# Patient Record
Sex: Male | Born: 1975 | Race: White | Hispanic: No | Marital: Single | Smoking: Current every day smoker
Health system: Southern US, Community
[De-identification: ages and names within clinical notes are randomized; demographics above are authoritative.]

## PROBLEM LIST (undated history)

## (undated) HISTORY — PX: BACK SURGERY: SHX140

---

## 2001-01-17 ENCOUNTER — Emergency Department (HOSPITAL_COMMUNITY): Admission: EM | Admit: 2001-01-17 | Discharge: 2001-01-17 | Payer: Self-pay

## 2001-01-18 ENCOUNTER — Encounter: Admission: RE | Admit: 2001-01-18 | Discharge: 2001-01-18 | Payer: Self-pay | Admitting: Internal Medicine

## 2001-01-25 ENCOUNTER — Encounter: Admission: RE | Admit: 2001-01-25 | Discharge: 2001-01-25 | Payer: Self-pay

## 2004-10-03 ENCOUNTER — Ambulatory Visit (HOSPITAL_COMMUNITY): Admission: RE | Admit: 2004-10-03 | Discharge: 2004-10-04 | Payer: Self-pay | Admitting: Neurological Surgery

## 2006-05-08 ENCOUNTER — Emergency Department (HOSPITAL_COMMUNITY): Admission: EM | Admit: 2006-05-08 | Discharge: 2006-05-08 | Payer: Self-pay | Admitting: Emergency Medicine

## 2020-09-19 ENCOUNTER — Other Ambulatory Visit: Payer: Self-pay | Admitting: Neurological Surgery

## 2020-09-19 DIAGNOSIS — M5416 Radiculopathy, lumbar region: Secondary | ICD-10-CM

## 2020-10-16 ENCOUNTER — Other Ambulatory Visit: Payer: Self-pay

## 2020-10-16 ENCOUNTER — Ambulatory Visit
Admission: RE | Admit: 2020-10-16 | Discharge: 2020-10-16 | Disposition: A | Discharge status: Home / Self Care | Payer: No Typology Code available for payment source | Source: Ambulatory Visit | Attending: Neurological Surgery | Admitting: Neurological Surgery

## 2020-10-16 DIAGNOSIS — M5416 Radiculopathy, lumbar region: Secondary | ICD-10-CM

## 2020-10-16 MED ORDER — GADOBENATE DIMEGLUMINE 529 MG/ML IV SOLN
17.0000 mL | Freq: Once | INTRAVENOUS | Status: AC | PRN
  Administered 2020-10-16: 17 mL via INTRAVENOUS

## 2021-07-12 ENCOUNTER — Emergency Department (HOSPITAL_COMMUNITY)
Admission: EM | Admit: 2021-07-12 | Discharge: 2021-07-13 | Disposition: A | Discharge status: Home / Self Care | Payer: No Typology Code available for payment source | Attending: Emergency Medicine | Admitting: Emergency Medicine

## 2021-07-12 ENCOUNTER — Emergency Department (HOSPITAL_COMMUNITY): Payer: No Typology Code available for payment source

## 2021-07-12 ENCOUNTER — Other Ambulatory Visit: Payer: Self-pay

## 2021-07-12 ENCOUNTER — Encounter (HOSPITAL_COMMUNITY): Payer: Self-pay

## 2021-07-12 DIAGNOSIS — F1124 Opioid dependence with opioid-induced mood disorder: Secondary | ICD-10-CM | POA: Insufficient documentation

## 2021-07-12 DIAGNOSIS — F419 Anxiety disorder, unspecified: Secondary | ICD-10-CM | POA: Insufficient documentation

## 2021-07-12 DIAGNOSIS — Z20822 Contact with and (suspected) exposure to covid-19: Secondary | ICD-10-CM | POA: Insufficient documentation

## 2021-07-12 DIAGNOSIS — R079 Chest pain, unspecified: Secondary | ICD-10-CM | POA: Insufficient documentation

## 2021-07-12 DIAGNOSIS — F32A Depression, unspecified: Secondary | ICD-10-CM

## 2021-07-12 DIAGNOSIS — R45851 Suicidal ideations: Secondary | ICD-10-CM | POA: Insufficient documentation

## 2021-07-12 NOTE — ED Provider Notes (Addendum)
Emergency Medicine Provider Triage Evaluation Note  Douglas Long , a 45 y.o. male  was evaluated in triage.  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 Also complaining of chest pain in the center of chest for 2 days. Feels anxious. Admits to some SOB. Also admits to suicidal ideations.   Review of Systems  Positive: CP, sob Negative: Nausea, vomiting, fevers   Physical Exam  BP (!) 164/88 (BP Location: Right Arm)   Pulse 94   Temp 98.6 F (37 C) (Oral)   Resp 16   Ht 5\' 6"  (1.676 m)   Wt 83.8 kg   SpO2 97%   BMI 29.83 kg/m  Gen:   Awake, no distress   Resp:  Normal effort  MSK:   Moves extremities without difficulty  Other:    Medical Decision Making  Medically screening exam initiated at 11:22 PM.  Appropriate orders placed.  Douglas Long was informed that the remainder of the evaluation will be completed by another provider, this initial triage assessment does not replace that evaluation, and the importance of remaining in the ED until their evaluation is complete.      Kerin Ransom, PA-C 07/12/21 2327    07/14/21, MD 07/13/21 [phone]

## 2021-07-12 NOTE — ED Triage Notes (Signed)
Pt reports needing detox for methadone and xanax. Pt endorses chest pain and feeling very anxious. Pt is SI, and denies HI and A/V hallucinations.

## 2021-07-13 ENCOUNTER — Inpatient Hospital Stay (HOSPITAL_COMMUNITY)
Admission: AD | Admit: 2021-07-13 | Discharge: 2021-07-18 | DRG: 897 | Disposition: A | Discharge status: Home / Self Care | Payer: Federal, State, Local not specified - Other | Source: Intra-hospital | Attending: Emergency Medicine | Admitting: Emergency Medicine

## 2021-07-13 ENCOUNTER — Other Ambulatory Visit: Payer: Self-pay | Admitting: Psychiatry

## 2021-07-13 ENCOUNTER — Encounter (HOSPITAL_COMMUNITY): Payer: Self-pay | Admitting: Psychiatry

## 2021-07-13 DIAGNOSIS — F1994 Other psychoactive substance use, unspecified with psychoactive substance-induced mood disorder: Secondary | ICD-10-CM | POA: Diagnosis present

## 2021-07-13 DIAGNOSIS — F1124 Opioid dependence with opioid-induced mood disorder: Secondary | ICD-10-CM | POA: Diagnosis present

## 2021-07-13 DIAGNOSIS — F1924 Other psychoactive substance dependence with psychoactive substance-induced mood disorder: Secondary | ICD-10-CM | POA: Diagnosis present

## 2021-07-13 DIAGNOSIS — F1120 Opioid dependence, uncomplicated: Secondary | ICD-10-CM | POA: Diagnosis present

## 2021-07-13 DIAGNOSIS — R45851 Suicidal ideations: Secondary | ICD-10-CM | POA: Diagnosis present

## 2021-07-13 DIAGNOSIS — F1310 Sedative, hypnotic or anxiolytic abuse, uncomplicated: Secondary | ICD-10-CM | POA: Diagnosis present

## 2021-07-13 DIAGNOSIS — E119 Type 2 diabetes mellitus without complications: Secondary | ICD-10-CM | POA: Diagnosis present

## 2021-07-13 LAB — COMPREHENSIVE METABOLIC PANEL
ALT: 204 U/L — ABNORMAL HIGH (ref 0–44)
AST: 142 U/L — ABNORMAL HIGH (ref 15–41)
Albumin: 4.5 g/dL (ref 3.5–5.0)
Alkaline Phosphatase: 73 U/L (ref 38–126)
Anion gap: 9 (ref 5–15)
BUN: 11 mg/dL (ref 6–20)
CO2: 27 mmol/L (ref 22–32)
Calcium: 9.4 mg/dL (ref 8.9–10.3)
Chloride: 101 mmol/L (ref 98–111)
Creatinine, Ser: 0.82 mg/dL (ref 0.61–1.24)
GFR, Estimated: >60 mL/min (ref >60)
Glucose, Bld: 92 mg/dL (ref 70–99)
Potassium: 3.5 mmol/L (ref 3.5–5.1)
Sodium: 137 mmol/L (ref 135–145)
Total Bilirubin: 0.8 mg/dL (ref 0.3–1.2)
Total Protein: 7.6 g/dL (ref 6.5–8.1)

## 2021-07-13 LAB — CBC
HCT: 40 % (ref 39.0–52.0)
Hemoglobin: 13.9 g/dL (ref 13.0–17.0)
MCH: 33.4 pg (ref 26.0–34.0)
MCHC: 34.8 g/dL (ref 30.0–36.0)
MCV: 96.2 fL (ref 80.0–100.0)
Platelets: 258 10*3/uL (ref 150–400)
RBC: 4.16 MIL/uL — ABNORMAL LOW (ref 4.22–5.81)
RDW: 12.9 % (ref 11.5–15.5)
WBC: 7.5 10*3/uL (ref 4.0–10.5)
nRBC: 0 % (ref 0.0–0.2)

## 2021-07-13 LAB — RAPID URINE DRUG SCREEN, HOSP PERFORMED
Amphetamines: NOT DETECTED
Barbiturates: NOT DETECTED
Benzodiazepines: POSITIVE — AB
Cocaine: NOT DETECTED
Opiates: NOT DETECTED
Tetrahydrocannabinol: NOT DETECTED

## 2021-07-13 LAB — TROPONIN I (HIGH SENSITIVITY): Troponin I (High Sensitivity): 6 ng/L (ref <18)

## 2021-07-13 LAB — RESP PANEL BY RT-PCR (FLU A&B, COVID) ARPGX2
Influenza A by PCR: NEGATIVE
Influenza B by PCR: NEGATIVE
SARS Coronavirus 2 by RT PCR: NEGATIVE

## 2021-07-13 LAB — SALICYLATE LEVEL: Salicylate Lvl: <7 mg/dL — ABNORMAL LOW (ref 7.0–30.0)

## 2021-07-13 LAB — ACETAMINOPHEN LEVEL: Acetaminophen (Tylenol), Serum: <10 ug/mL — ABNORMAL LOW (ref 10–30)

## 2021-07-13 LAB — ETHANOL: Alcohol, Ethyl (B): <10 mg/dL (ref <10)

## 2021-07-13 MED ORDER — HYDROXYZINE HCL 25 MG PO TABS: 25.0000 mg | ORAL_TABLET | Freq: Four times a day (QID) | ORAL | Status: DC | PRN

## 2021-07-13 MED ORDER — NAPROXEN 500 MG PO TABS
500.0000 mg | ORAL_TABLET | Freq: Two times a day (BID) | ORAL | Status: DC | PRN
  Administered 2021-07-13 – 2021-07-17 (×5): 500 mg via ORAL
  Filled 2021-07-13 (×5): qty 1

## 2021-07-13 MED ORDER — ONDANSETRON 4 MG PO TBDP: 4.0000 mg | ORAL_TABLET | Freq: Four times a day (QID) | ORAL | Status: DC | PRN

## 2021-07-13 MED ORDER — LORAZEPAM 1 MG PO TABS
1.0000 mg | ORAL_TABLET | Freq: Four times a day (QID) | ORAL | Status: AC
  Administered 2021-07-13 – 2021-07-14 (×5): 1 mg via ORAL
  Filled 2021-07-13 (×5): qty 1

## 2021-07-13 MED ORDER — LORAZEPAM 1 MG PO TABS
1.0000 mg | ORAL_TABLET | Freq: Once | ORAL | Status: AC
  Administered 2021-07-13: 1 mg via ORAL
  Filled 2021-07-13: qty 1

## 2021-07-13 MED ORDER — LORAZEPAM 1 MG PO TABS
2.0000 mg | ORAL_TABLET | Freq: Once | ORAL | Status: AC
  Administered 2021-07-13: 2 mg via ORAL

## 2021-07-13 MED ORDER — LORAZEPAM 1 MG PO TABS
1.0000 mg | ORAL_TABLET | Freq: Four times a day (QID) | ORAL | Status: DC | PRN
  Administered 2021-07-14 (×2): 1 mg via ORAL
  Filled 2021-07-13 (×3): qty 1

## 2021-07-13 MED ORDER — LORAZEPAM 1 MG PO TABS
2.0000 mg | ORAL_TABLET | Freq: Once | ORAL | Status: AC
  Administered 2021-07-13: 2 mg via ORAL
  Filled 2021-07-13: qty 2

## 2021-07-13 MED ORDER — DICYCLOMINE HCL 20 MG PO TABS
20.0000 mg | ORAL_TABLET | Freq: Four times a day (QID) | ORAL | Status: DC | PRN
  Administered 2021-07-13 – 2021-07-17 (×4): 20 mg via ORAL
  Filled 2021-07-13 (×4): qty 1

## 2021-07-13 MED ORDER — HYDROXYZINE HCL 25 MG PO TABS
25.0000 mg | ORAL_TABLET | Freq: Four times a day (QID) | ORAL | Status: AC | PRN
  Administered 2021-07-13 – 2021-07-16 (×4): 25 mg via ORAL
  Filled 2021-07-13 (×4): qty 1

## 2021-07-13 MED ORDER — THIAMINE HCL 100 MG PO TABS
100.0000 mg | ORAL_TABLET | Freq: Every day | ORAL | Status: DC
Start: 2021-07-14 — End: 2021-07-18
  Administered 2021-07-14 – 2021-07-18 (×5): 100 mg via ORAL
  Filled 2021-07-13 (×7): qty 1

## 2021-07-13 MED ORDER — LOPERAMIDE HCL 2 MG PO CAPS
2.0000 mg | ORAL_CAPSULE | ORAL | Status: AC | PRN
  Filled 2021-07-13: qty 2

## 2021-07-13 MED ORDER — LORAZEPAM 1 MG PO TABS
1.0000 mg | ORAL_TABLET | Freq: Three times a day (TID) | ORAL | Status: AC
  Administered 2021-07-15 (×3): 1 mg via ORAL
  Filled 2021-07-13 (×3): qty 1

## 2021-07-13 MED ORDER — LORAZEPAM 1 MG PO TABS
1.0000 mg | ORAL_TABLET | Freq: Every day | ORAL | Status: AC
  Administered 2021-07-17: 1 mg via ORAL
  Filled 2021-07-13: qty 1

## 2021-07-13 MED ORDER — ONDANSETRON 4 MG PO TBDP
4.0000 mg | ORAL_TABLET | Freq: Four times a day (QID) | ORAL | Status: AC | PRN
  Administered 2021-07-13 – 2021-07-15 (×3): 4 mg via ORAL
  Filled 2021-07-13 (×4): qty 1

## 2021-07-13 MED ORDER — NAPROXEN 500 MG PO TABS
500.0000 mg | ORAL_TABLET | Freq: Two times a day (BID) | ORAL | Status: DC | PRN
Start: 2021-07-13 — End: 2021-07-13

## 2021-07-13 MED ORDER — METHOCARBAMOL 500 MG PO TABS
500.0000 mg | ORAL_TABLET | Freq: Three times a day (TID) | ORAL | Status: DC | PRN
  Administered 2021-07-13 – 2021-07-17 (×7): 500 mg via ORAL
  Filled 2021-07-13 (×7): qty 1

## 2021-07-13 MED ORDER — NICOTINE 21 MG/24HR TD PT24
21.0000 mg | MEDICATED_PATCH | Freq: Once | TRANSDERMAL | Status: DC
  Administered 2021-07-13: 21 mg via TRANSDERMAL
  Filled 2021-07-13: qty 1

## 2021-07-13 MED ORDER — LOPERAMIDE HCL 2 MG PO CAPS: 2.0000 mg | ORAL_CAPSULE | ORAL | Status: DC | PRN

## 2021-07-13 MED ORDER — LORAZEPAM 1 MG PO TABS
ORAL_TABLET | ORAL | Status: AC
  Filled 2021-07-13: qty 2

## 2021-07-13 MED ORDER — NICOTINE 14 MG/24HR TD PT24
14.0000 mg | MEDICATED_PATCH | Freq: Every day | TRANSDERMAL | Status: DC
  Administered 2021-07-14 – 2021-07-18 (×5): 14 mg via TRANSDERMAL
  Filled 2021-07-13 (×8): qty 1

## 2021-07-13 MED ORDER — METHOCARBAMOL 500 MG PO TABS: 500.0000 mg | ORAL_TABLET | Freq: Three times a day (TID) | ORAL | Status: DC | PRN

## 2021-07-13 MED ORDER — DICYCLOMINE HCL 20 MG PO TABS: 20.0000 mg | ORAL_TABLET | Freq: Four times a day (QID) | ORAL | Status: DC | PRN

## 2021-07-13 MED ORDER — LORAZEPAM 1 MG PO TABS
1.0000 mg | ORAL_TABLET | Freq: Two times a day (BID) | ORAL | Status: AC
  Administered 2021-07-16 (×2): 1 mg via ORAL
  Filled 2021-07-13 (×2): qty 1

## 2021-07-13 MED ORDER — THIAMINE HCL 100 MG/ML IJ SOLN: 100.0000 mg | Freq: Once | INTRAMUSCULAR | Status: DC

## 2021-07-13 MED ORDER — ADULT MULTIVITAMIN W/MINERALS CH
1.0000 | ORAL_TABLET | Freq: Every day | ORAL | Status: DC
Start: 2021-07-13 — End: 2021-07-18
  Administered 2021-07-14 – 2021-07-18 (×5): 1 via ORAL
  Filled 2021-07-13 (×9): qty 1

## 2021-07-13 NOTE — Progress Notes (Signed)
CSW provided that resources listed below for the patient.   Substance Abuse Treatment:   [facility] ([facility])   Address: [address], [place] [address] Phone: [phone]  -[facility] is a licensed, innovative, multi-year residential treatment program. [facility] is a cost-free program. You do not need insurance. [facility]ett Harry is a Field seismologist. Each day, [facility] gives more than 400 men and women the tools and support they need to be productive, recovering individuals by providing life skills and vocational training, education, health services, counseling, mentoring, and continuing care. Most importantly, we provide a safe space for peers to help each other in their recovery. We provide these services and housing, food, clothing, and personal care items to every resident at no charge.  [facility] - Admissions are currently completed Monday through Friday at 8am; both appointments and walk-ins are accepted.  Any individual that is a [facility] resident may present for a substance abuse screening and assessment for admission.  A person may be referred by numerous sources or self-refer.   Potential clients will be screened for medical necessity and appropriateness for the program.  Clients must meet criteria for high-intensity residential treatment services.  If clinically appropriate, a client will continue with the comprehensive clinical assessment and intake process, as well as enrollment in the [facility].  Address: [address] [place], [place] [address] Admin Hours: Mon-Fri 8AM to [facility] Hours: 24/7 Phone: [phone] Fax: [phone]  [facility] (Det[facility]) Facility Based Crisis:  These are 3 locations for services: Please call before arrival   Address: [address] [place], [place] [address] Phone: [phone]  Address: [address] [place], [place] [address] Phone#: [phone]  Address:  [address] [place], [place] [address] Phone#: [phone]   [facility] ([facility]): (offers outpatient therapy and intensive outpatient substance abuse therapy).  [address] [place], [place] [address] Phone: [phone]  [facility]: Offers FREE recovery skills classes, support groups, 1:1 Peer Support, and Compeer Classes. [address], [place], [place] [address] Phone: [phone] (Call to complete intake).  [facility] [address] [place], [place] [address] Phone: [phone] ext: [phone] [facility] provides food, shelter and other programs and services to the homeless men of [place] through our [employer].  By offering safe shelter, three meals a day, clean clothing, Biblical counseling, financial planning, vocational training, GED/education and employment assistance, we've helped mend the shattered lives of many homeless men since opening in 1974.  We have approximately 267 beds available, with a max of 312 beds including mats for emergency situations and currently house an average of 270 men a night.  Prospective Client Check-In Information Photo ID Required (State/ Out of State/ [facility]) - if photo ID is not available, clients are required to have a printout of a police/sheriff's criminal history report. Help out with chores around the [facility]. No sex offender of any type (pending, charged, registered and/or any other sex related offenses) will be permitted to check in. Must be willing to abide by all rules, regulations, and policies established by the [employer]. The following will be provided - shelter, food, clothing, and biblical counseling. If you or someone you know is in need of assistance at our [facility] shelter in [place], [place], please call [phone] ext. [phone].  [facility]-will provide timely access to mental health  services for children and adolescents (4-17) and adults presenting in a mental health crisis. The program is designed for those who need urgent Behavioral Health or Substance Use treatment and are not experiencing a medical crisis that would typically require an emergency room visit.    [address] [place], [place] [address] Phone: [phone] [email or web address]  [facility]: Phone#: [phone]  The [facility] SA Intensive Outpatient Program (SAIOP) means structured individual and group addiction activities and services that are provided at an outpatient program designed to assist adult and adolescent consumers to begin recovery and learn skills for recovery maintenance. The [employer] SAIOP program is offered at least 3 hours a day, 3 days a week.SAIOP services shall include a structured program consisting of, but not limited to, the following services: Individual counseling and support; Group counseling and support; Family counseling, training or support; Biochemical assays to identify recent drug use (e.g., urine drug screens); Strategies for relapse prevention to include community and social support systems in treatment; Life skills; Crisis contingency planning; Disease Management; and Treatment support activities that have been adapted or specifically designed for persons with physical disabilities, or persons with co-occurring disorders of mental illness and substance abuse/dependence or mental retardation/developmental disability and substance abuse/dependence. Phone: [phone]  Address:   The [facility] will also offer the following outpatient services: (Monday through Friday 8am-5pm)   Partial Hospitalization Program (PHP) Substance Abuse Intensive Outpatient Program (SA-IOP) Group Therapy Medication Management Peer Living Room We also provide (24/7):  Assessments: Our mental health clinician and providers will conduct a  focused mental health evaluation, assessing for immediate safety concerns and further mental health needs. Referral: Our team will provide resources and help connect to community based mental health treatment, when indicated, including psychotherapy, psychiatry, and other specialized behavioral health or substance use disorder services (for those not already in treatment). Transitional Care: Our team providers in person bridging and/or telephonic follow-up during the patient's transition to outpatient services.  The [facility] 24-Hour Call Center: [phone] Behavioral Health Crisis Line: [phone]  Crissie Reese, MSW, LCSW-A, [place] Phone: [phone] Disposition/TOC

## 2021-07-13 NOTE — ED Notes (Signed)
Safe transport notified that pt needs a ride to [facility]. Pt transporter he will be here around 3pm

## 2021-07-13 NOTE — Tx Team (Signed)
Initial Treatment Plan 07/13/2021 7:01 PM Douglas Long ATF:[id number]    PATIENT STRESSORS: Health problems Marital or family conflict Substance abuse   PATIENT STRENGTHS: Ability for insight Communication skills Supportive family/friends   PATIENT IDENTIFIED PROBLEMS: Suicidal ideation  Depression  Anxiety  Substance use disorder methadone and benzos               DISCHARGE CRITERIA:  Ability to meet basic life and health needs Improved stabilization in mood, thinking, and/or behavior Verbal commitment to aftercare and medication compliance  PRELIMINARY DISCHARGE PLAN: Attend aftercare/continuing care group Attend 12-step recovery group Return to previous living arrangement  PATIENT/FAMILY INVOLVEMENT: This treatment plan has been presented to and reviewed with the patient, Douglas Long.  The patient has been given the opportunity to ask questions and make suggestions.  Garnette Scheuermann, RN 07/13/2021, 7:01 PM

## 2021-07-13 NOTE — Progress Notes (Signed)
   07/13/21 2242  Psych Admission Type (Psych Patients Only)  Admission Status Voluntary  Psychosocial Assessment  Patient Complaints Substance abuse  Eye Contact Fair  Facial Expression Flat  Affect Appropriate to circumstance  Speech Logical/coherent  Interaction Minimal  Motor Activity Fidgety  Appearance/Hygiene Unremarkable  Behavior Characteristics Appropriate to situation  Mood Anxious  Thought Process  Coherency WDL  Content WDL  Delusions None reported or observed  Perception WDL  Hallucination None reported or observed  Judgment Impaired  Confusion None  Danger to Self  Current suicidal ideation? Denies  Danger to Others  Danger to Others None reported or observed

## 2021-07-13 NOTE — ED Provider Notes (Signed)
[facility]-EMERGENCY DEPT Provider Note   CSN: [id number] Arrival date & time: 07/12/21  2301     History Chief Complaint  Patient presents with   Suicidal    Douglas Long is a 45 y.o. male.  The history is provided by the patient and medical records.   45 y.o. M here with multiple complaints.  Reports he needs help with addiction to methadone and xanax.  Last use earlier today.  States he uses both of these substances on a regular basis but has gotten "off schedule" recently.  He states he feels incredibly depressed because he has become reliant on the substances and does not know how to get off of them.  He states he feels like he is a huge disappointment and has no further will to live.  Does endorse some suicidal ideation without specific plan.  States his parents brought him here because they are concerned about his mental state.  He states he does not think they understand what he is going through due to the age difference.  He does have a significant other who somewhat understands, however he does not feel that he can relate to his issues.  He does not take any medication for depression currently.  He denies any illicit drug use.  No significant alcohol abuse.  Patient also reports some vague chest pain.  Reports this comes and goes, center of the chest.  He feels like it is mostly related to his anxiety.  He denies any cough, fever, or upper respiratory symptoms.  History reviewed. No pertinent past medical history.  There are no problems to display for this patient.   History reviewed. No pertinent surgical history.     History reviewed. No pertinent family history.     Home Medications Prior to Admission medications   Not on File    Allergies    Patient has no known allergies.  Review of Systems   Review of Systems  Cardiovascular:  Positive for chest pain.  Psychiatric/Behavioral:  Positive for suicidal ideas.   All other systems  reviewed and are negative.  Physical Exam Updated Vital Signs BP (!) 164/88 (BP Location: Right Arm)   Pulse 94   Temp 98.6 F (37 C) (Oral)   Resp 16   Ht 5\' 6"  (1.676 m)   Wt 83.8 kg   SpO2 97%   BMI 29.83 kg/m   Physical Exam Vitals and nursing note reviewed.  Constitutional:      Appearance: He is well-developed.  HENT:     Head: Normocephalic and atraumatic.  Eyes:     Conjunctiva/sclera: Conjunctivae normal.     Pupils: Pupils are equal, round, and reactive to light.  Cardiovascular:     Rate and Rhythm: Normal rate and regular rhythm.     Heart sounds: Normal heart sounds.  Pulmonary:     Effort: Pulmonary effort is normal.     Breath sounds: Normal breath sounds.  Abdominal:     General: Bowel sounds are normal.     Palpations: Abdomen is soft.  Musculoskeletal:        General: Normal range of motion.     Cervical back: Normal range of motion.  Skin:    General: Skin is warm and dry.  Neurological:     Mental Status: He is alert and oriented to person, place, and time.  Psychiatric:        Mood and Affect: Mood is anxious.     Comments: Anxious  appearing, fidgeting throughout exam Endorses SI without plan, denies HI/AVH    ED Results / Procedures / Treatments   Labs (all labs ordered are listed, but only abnormal results are displayed) Labs Reviewed  CBC - Abnormal; Notable for the following components:      Result Value   RBC 4.16 (*)    All other components within normal limits  COMPREHENSIVE METABOLIC PANEL - Abnormal; Notable for the following components:   AST 142 (*)    ALT 204 (*)    All other components within normal limits  RAPID URINE DRUG SCREEN, HOSP PERFORMED - Abnormal; Notable for the following components:   Benzodiazepines POSITIVE (*)    All other components within normal limits  ACETAMINOPHEN LEVEL - Abnormal; Notable for the following components:   Acetaminophen (Tylenol), Serum <10 (*)    All other components within normal  limits  SALICYLATE LEVEL - Abnormal; Notable for the following components:   Salicylate Lvl <7.0 (*)    All other components within normal limits  RESP PANEL BY RT-PCR (FLU A&B, COVID) ARPGX2  ETHANOL  TROPONIN I (HIGH SENSITIVITY)    EKG None  Radiology DG Chest Port 1 View  Result Date: 07/12/2021 CLINICAL DATA:  Chest pain and shortness of breath. EXAM: PORTABLE CHEST 1 VIEW COMPARISON:  None. FINDINGS: The cardiomediastinal contours are normal. Slight elevation of right hemidiaphragm. The lungs are clear. Pulmonary vasculature is normal. No consolidation, pleural effusion, or pneumothorax. No acute osseous abnormalities are seen. IMPRESSION: No acute chest findings. Electronically Signed   By: Narda Rutherford M.D.   On: 07/12/2021 23:49    Procedures Procedures   Medications Ordered in ED Medications - No data to display  ED Course  I have reviewed the triage vital signs and the nursing notes.  Pertinent labs & imaging results that were available during my care of the patient were reviewed by me and considered in my medical decision making (see chart for details).    MDM Rules/Calculators/A&P                           45 year old male brought in by parents for mental health evaluation.  Reportedly has been using methadone and Xanax for several years now and feels dependent on the substances causing worsening depression.  Does admit to some suicidal ideation but no specific plan.  Denies any homicidal ideation or hallucinations.  Denies illicit drug use.  He is afebrile and nontoxic.  No physical complaints at this time although did reports some chest pain recent, ultimately felt like this was due to his anxiety.  Vitals are stable.  EKG is nonischemic.  Labs are overall reassuring-- LFT's are elevated but no prior for comparison.  No abdominal pain, normal bili.  Tylenol level WNL.  Chest x-ray is clear.  COVID screen negative.  Patient medically cleared.  Will get TTS  evaluation.  TTS has evaluated and recommends inpatient placement.  No available beds at this time so social work will seek placement in the morning.  Final Clinical Impression(s) / ED Diagnoses Final diagnoses:  Depression, unspecified depression type    Rx / DC Orders ED Discharge Orders     None        Garlon Hatchet, PA-C 07/13/21 0504    Maia Plan, MD 07/13/21 0630

## 2021-07-13 NOTE — Discharge Instructions (Signed)
Substance Abuse Treatment:   [facility] ([facility])   [address]: [address], [place] [address] Phone: [phone]  -[facility] is a licensed, innovative, multi-year residential treatment program. [facility] is a cost-free program. You do not need insurance. Burnett Harry is a Field seismologist. Each day, [facility] gives more than 400 men and women the tools and support they need to be productive, recovering individuals by providing life skills and vocational training, education, health services, counseling, mentoring, and continuing care. Most importantly, we provide a safe space for peers to help each other in their recovery. We provide these services and housing, food, clothing, and personal care items to every resident at no charge.  [facility] - Admissions are currently completed Monday through Friday at 8am; both appointments and walk-ins are accepted.  Any individual that is a [facility] resident may present for a substance abuse screening and assessment for admission.  A person may be referred by numerous sources or self-refer.   Potential clients will be screened for medical necessity and appropriateness for the program.  Clients must meet criteria for high-intensity residential treatment services.  If clinically appropriate, a client will continue with the comprehensive clinical assessment and intake process, as well as enrollment in the [facility].  Address: [address] [place], [place] [address] Admin Hours: Mon-Fri 8AM to [facility] Hours: 24/7 Phone: [phone] Fax: [phone]  [facility] ([facility]) Facility Based Crisis:  These are 3 locations for services: Please call before arrival   Address: [address] [place], [place] [address] Phone: [phone]  Address: [address] [place], [place] [address] Phone#: [phone]  Address: [address] [place], [place] [address] Phone#:  [phone]   [facility] ([facility]): (offers outpatient therapy and intensive outpatient substance abuse therapy).  [address], [place], [place] [address] Phone: [phone]  [facility] [place] [facility]: Offers FREE recovery skills classes, support groups, 1:1 Peer Support, and Compeer Classes. [address] [place], [place] [address] Phone: [phone] (Call to complete intake).  [facility] [address] [place], [place] [address] Phone: [phone] ext: [phone] The [facility] provides food, shelter and other programs and services to the homeless men of [place] through our [employer].  By offering safe shelter, three meals a day, clean clothing, Biblical counseling, financial planning, vocational training, GED/education and employment assistance, we've helped mend the shattered lives of many homeless men since opening in 1974.  We have approximately 267 beds available, with a max of 312 beds including mats for emergency situations and currently house an average of 270 men a night.  Prospective Client Check-In Information Photo ID Required (State/ Out of State/ [facility]) - if photo ID is not available, clients are required to have a printout of a police/sheriff's criminal history report. Help out with chores around the [facility]. No sex offender of any type (pending, charged, registered and/or any other sex related offenses) will be permitted to check in. Must be willing to abide by all rules, regulations, and policies established by the [employer]. The following will be provided - shelter, food, clothing, and biblical counseling. If you or someone you know is in need of assistance at our [facility] shelter in [place], [place], please call [phone] ext. [phone].  [facility]-will provide timely access to mental health services for children and adolescents (4-17) and adults presenting in a  mental health crisis. The program is designed for those who need urgent Behavioral Health or Substance Use treatment and are not experiencing a medical crisis that would typically require an emergency room visit.    [address] [place], [place] [address] Phone: [phone] [email or web address]  [facility]: Phone#: [phone]  The [facility] SA Intensive Outpatient Program (SAIOP) means structured individual and group addiction activities and services that are provided at an outpatient program designed to assist adult and adolescent consumers to begin recovery and learn skills for recovery maintenance. The [employer] SAIOP program is offered at least 3 hours a day, 3 days a week.SAIOP services shall include a structured program consisting of, but not limited to, the following services: Individual counseling and support; Group counseling and support; Family counseling, training or support; Biochemical assays to identify recent drug use (e.g., urine drug screens); Strategies for relapse prevention to include community and social support systems in treatment; Life skills; Crisis contingency planning; Disease Management; and Treatment support activities that have been adapted or specifically designed for persons with physical disabilities, or persons with co-occurring disorders of mental illness and substance abuse/dependence or mental retardation/developmental disability and substance abuse/dependence. Phone: [phone]  Address:   [facility] will also offer the following outpatient services: (Monday through Friday 8am-5pm)   Partial Hospitalization Program (PHP) Substance Abuse Intensive Outpatient Program (SA-IOP) Group Therapy Medication Management Peer Living Room We also provide (24/7):  Assessments: Our mental health clinician and providers will conduct a focused mental health evaluation, assessing for immediate safety concerns  and further mental health needs. Referral: Our team will provide resources and help connect to community based mental health treatment, when indicated, including psychotherapy, psychiatry, and other specialized behavioral health or substance use disorder services (for those not already in treatment). Transitional Care: Our team providers in person bridging and/or telephonic follow-up during the patient's transition to outpatient services.  The [facility] 24-Hour Call Center: [phone] Behavioral Health Crisis Line: [phone]

## 2021-07-13 NOTE — BH Assessment (Signed)
TTS spoke to Douglas Long, [place], to put pt in a private room to complete TTS assessment.  Clinician to call the cart in ten minutes.

## 2021-07-13 NOTE — Progress Notes (Signed)
Pt is a 45 y.o. male admitted with SI and withdrawing from methodone and benzodiazepines.  Pt said that he needs to have surgery on his back and his orthopedic doctor would not perform pt's back surgery because pt was taking 175mg  of methodone and benzodiazepines 4mg  per day.  Pt said he was trying to withdraw gradually but thinks he went off methodone too quickly.  Pt hasn't had methodone in several days. Pt given ativan twice in ED.   Pt denies SI and HI on [facility] admission, but endorses visual hallucinations and "seeing stars."  Pt says he hasn't slept in 2 weeks; "only 10 minutes here 20 minutes there." Pt administered 2 mg of Ativan when he got settled on the unit.  Pt is resting at this time.  RN initiated q 15 min safety checks.

## 2021-07-13 NOTE — ED Notes (Signed)
TTS consult in progress. °

## 2021-07-13 NOTE — BH Assessment (Signed)
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 Acute risk factors for suicide include: family or marital conflict, unemployment, social withdrawal/isolation, and loss (financial, interpersonal, professional). Protective factors for this patient include: positive social support, positive therapeutic relationship, coping skills, hope for the future, and life satisfaction. Considering these factors, the overall suicide risk at this point appears to be high. Patient is not appropriate for outpatient follow up.  Disposition Roselyn Bering NP, patient meets inpatient criteria, [facility] AC contacted.  Disposition Social Worker will secure substance use placement in the AM.  Disposition discussed with Chemical engineer, via secure chat in Epic.  RN to discuss disposition with EDP.   Pamala Duffel. Douglas Long is a 45 years old male who presents voluntarily to [facility] and accompanied by his parents, Greycen Felter, [phone], who participated in the assessment at Pt's request via telephone. Per dad: He came to the house yesterday afternoon and wouldn't say anything and wouldn't eat;  he just laid on the floor.  After lunch today he started saying he wished  he was dead, he wished someone would shoot him.  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  I think he goes somewhere and get some; I think he goes somewhere and gets it.  He used to go the meth clinic in  [place] but that's been several years back. Never attempted to kill himself in the past. Pt reports SI and prior suicidal thoughts by wanting to shoot himself.  Pt denies HI and prior homicidal attempts.  Pt reports the following symptoms, isolating, irritable, hopelessness, guilt, worthlessness and talking negative about himself.  Pt reports that he have not been sleeping during the night; also reports that he have not been eating (skipping meals).  Pt denied AVH.  Pt reports "I feel paranoia all the time, I feel that someone is following me".  Pt admits to using methadone and xanax daily.  Pt denies drinking alcohol and using any other substance.  Pt reports that he smokes cigarettes; also reports that he have vaped in the passed.  Pt identifies his primary stressor as his parents "I have disappointed them, I feel bad when I am around my parents".  Pt reports that he lives with his parents; also, currently unemployed.  Pt denies family history of mental illness; also, denies history of substance used..  Pt denies any history of abuse or trauma.  Pt denies any current legal problems.  Pt reports no guns or weapons or in the house.  Pt says he is not currently receiving weekly outpatient therapy; also, reports that he is not receiving outpatient medication management.  Pt reports that he had five years of sobriety, due to receiving  Medication Assistance Treatment from [facility] in [facility] (1996-2001).   Pt reports no prior inpatient psychiatric hospitalization.  Pt is dressed in scrubs, alert, oriented x 4 with loud speech and restless motor behavior.   Eye contact is normal.  Pt mood  anxious and affect depressed.  Thought process is relevant.  Pt's insight is good and judgment is impaired.  There is no indication Pt is currently responding to internal stimuli or experiencing delusional thought content.  Pt was guarded throughout assessment.   CCA Screening, Triage and Referral (STR)  Patient Reported  Information How did you hear about us? Family/Friend  What Is the Reason for Your Visit/Call Today? SI, depressed  How Long Has This Been Causing You Problems? 1 wk - 1 month  What Do You Feel Would Help You the Most Today? Alcohol or Drug Use Treatment; Treatment for Depression or other mood problem   Have You Recently Had Any Thoughts About Hurting Yourself? Yes  Are You Planning to Commit Suicide/Harm Yourself At This time? No   Have you Recently Had Thoughts About Hurting Someone Karolee Ohslse? No  Are You Planning to Harm Someone at This Time? No  Explanation: No data recorded  Have You Used Any Alcohol or Drugs in the Past 24 Hours? No  How Long Ago Did You Use Drugs or Alcohol? No data recorded What Did You Use and How Much? No data recorded  Do You Currently Have a Therapist/Psychiatrist? No  Name of Therapist/Psychiatrist: No data recorded  Have You Been Recently Discharged From Any Office Practice or Programs? No  Explanation of Discharge From Practice/Program: No data recorded    CCA Screening Triage Referral Assessment Type of Contact: Tele-Assessment  Telemedicine Service Delivery: Telemedicine service delivery: This service was provided via telemedicine using a 2-way, interactive audio and video technology  Is this Initial or Reassessment? Initial Assessment  Date Telepsych consult ordered in [facility]:  07/13/21  Time Telepsych consult ordered in [facility]:  No data recorded [facility]  [facility]   Collateral Involvement: No collateral involved   Does Patient Have a Automotive engineerCourt Appointed Legal Guardian? No data recorded Name and Contact of Legal Guardian: No data recorded If Minor and Not Living with Parent(s), Who has Custody? n/a  Is CPS involved or ever been involved? Never  Is APS involved or ever been involved? Never   Patient Determined To Be At Risk for Harm To Self or Others Based on  Review of Patient Reported Information or Presenting Complaint? Yes, for Self-Harm  Method: No data recorded Availability of Means: No data recorded Intent: No data recorded Notification Required: No data recorded Additional Information for Danger to Others Potential: No data recorded Additional Comments for Danger to Others Potential: No data recorded Are There Guns or Other Weapons in Your Home? No data recorded Types of Guns/Weapons: No data recorded Are These Weapons Safely Secured?                            No data recorded Who Could Verify You Are Able To Have These Secured: No data recorded Do You Have any Outstanding Charges, Pending Court Dates, Parole/Probation? No data recorded Contacted To Inform of Risk of Harm To Self or Others: Family/Significant Other:    Does Patient Present under Involuntary Commitment? No  IVC Papers Initial File Date: No data recorded  [place]   Patient Currently Receiving the Following Services: SAIOP (Substance Abuse Intensive Outpatient Program   Determination of Need: Urgent (48 hours)   Options For Referral: Medication Management; Chemical Dependency Intensive Outpatient Therapy (CDIOP)     CCA Biopsychosocial Patient Reported Schizophrenia/Schizoaffective Diagnosis in Past: No  Strengths: asking for support   Mental Health Symptoms Depression:   Change in energy/activity; Sleep (too much or little); Difficulty Concentrating; Fatigue; Hopelessness; Worthlessness   Duration of Depressive symptoms:  Duration of Depressive Symptoms: Less than two weeks   Mania:   None   Anxiety:    Fatigue; Difficulty concentrating; Irritability; Restlessness; Sleep; Tension; Worrying   Psychosis:   None   Duration of Psychotic symptoms:    Trauma:   None   Obsessions:   Disrupts routine/functioning; Poor insight; Recurrent & persistent thoughts/impulses/images   Compulsions:   Disrupts with  routine/functioning; Intended to reduce stress or prevent another outcome; Not connected to stressor   Inattention:   None   Hyperactivity/Impulsivity:   None   Oppositional/Defiant Behaviors:   None   Emotional Irregularity:   Chronic feelings of emptiness; Intense/unstable relationships; Recurrent suicidal behaviors/gestures/threats; Potentially harmful impulsivity; Transient, stress-related paranoia/disassociation   Other Mood/Personality Symptoms:   disrupted sleep    Mental Status Exam Appearance and self-care  Stature:   Average   Weight:   Overweight   Clothing:   Casual   Grooming:   Neglected   Cosmetic use:   Age appropriate   Posture/gait:   Normal   Motor activity:   Agitated; Restless   Sensorium  Attention:   Persistent; Confused   Concentration:   Anxiety interferes; Preoccupied   Orientation:   Object; Person; Place; Situation   Recall/memory:   Normal   Affect and Mood  Affect:   Anxious; Depressed   Mood:   Angry; Anxious; Depressed   Relating  Eye contact:   Normal   Facial expression:   Sad; Anxious   Attitude toward examiner:   Suspicious   Thought and Language  Speech flow:  Loud; Pressured   Thought content:   Appropriate to Mood and Circumstances   Preoccupation:   Guilt; Suicide   Hallucinations:   None   Organization:  No data recorded  [employer] of Knowledge:   Average   Intelligence:   Average   Abstraction:   Normal   Judgement:   Impaired   Reality Testing:   Variable   Insight:   Good   Decision Making:   Impulsive   Social Functioning  Social Maturity:   Impulsive; Isolates   Social Judgement:   Impropriety   Stress  Stressors:   Family conflict; Relationship   Coping Ability:   Exhausted   Skill Deficits:   Decision making; Self-care; Self-control; Responsibility   Supports:   Family     Religion: Religion/Spirituality Are You A Religious  Person?:  (UTA) How Might This Affect Treatment?: UTA  Leisure/Recreation: Leisure / Recreation Do You Have Hobbies?: Yes Leisure and Hobbies: fishing  Exercise/Diet: Exercise/Diet Do You Exercise?: No Have You Gained or Lost A Significant Amount of Weight in the Past Six Months?: No Do You Follow a Special Diet?: No Do You Have Any Trouble Sleeping?: Yes Explanation of Sleeping Difficulties: Pt reports that he is sleeping one hour during the night.   CCA Employment/Education Employment/Work Situation: Employment / Work Situation Employment Situation: Unemployed Patient's Job has Been Impacted by Current Illness: No Has Patient ever Been in Equities trader?: No  Education: Education Is Patient Currently Attending School?: No Last Grade Completed: 2 Did You Product manager?: No Did You Have An Individualized Education Program (IIEP):  (UTA) Did You Have Any Difficulty At School?:  (UTA) Patient's Education Has Been Impacted by Current Illness:  (UTA)   CCA  Family/Childhood History Family and Relationship History: Family history Marital status: Single Does patient have children?: No  Childhood History:  Childhood History By whom was/is the patient raised?: Both parents Did patient suffer any verbal/emotional/physical/sexual abuse as a child?: No Did patient suffer from severe childhood neglect?: No Has patient ever been sexually abused/assaulted/raped as an adolescent or adult?: No Was the patient ever a victim of a crime or a disaster?: No Witnessed domestic violence?: No Has patient been affected by domestic violence as an adult?: No  Child/Adolescent Assessment:     CCA Substance Use Alcohol/Drug Use: Alcohol / Drug Use Pain Medications: See MRA Prescriptions: See MRA Over the Counter: See MRA History of alcohol / drug use?: No history of alcohol / drug abuse Longest period of sobriety (when/how long): 5 years, 1996 - 2001 Negative Consequences of Use:  Personal relationships, Financial Withdrawal Symptoms: Agitation, Aggressive/Assaultive, Patient aware of relationship between substance abuse and physical/medical complications, Sweats                         ASAM's:  Six Dimensions of Multidimensional Assessment  Dimension 1:  Acute Intoxication and/or Withdrawal Potential:   Dimension 1:  Description of individual's past and current experiences of substance use and withdrawal: Pt reports that he uses methadone and xanax daily  Dimension 2:  Biomedical Conditions and Complications:   Dimension 2:  Description of patient's biomedical conditions and  complications: pt reports no biomedical conditons  Dimension 3:  Emotional, Behavioral, or Cognitive Conditions and Complications:  Dimension 3:  Description of emotional, behavioral, or cognitive conditions and complications: depression and anxiety  Dimension 4:  Readiness to Change:  Dimension 4:  Description of Readiness to Change criteria: Pt reports that he wants to change, have not started cutting back  Dimension 5:  Relapse, Continued use, or Continued Problem Potential:  Dimension 5:  Relapse, continued use, or continued problem potential critiera description: Pt continued use/precontemplation  Dimension 6:  Recovery/Living Environment:  Dimension 6:  Recovery/Iiving environment criteria description: Pt reports that he  lives in a safe environment  ASAM Severity Score: ASAM's Severity Rating Score: 9  ASAM Recommended Level of Treatment: ASAM Recommended Level of Treatment: Level II Intensive Outpatient Treatment   Substance use Disorder (SUD) Substance Use Disorder (SUD)  Checklist Symptoms of Substance Use: Continued use despite having a persistent/recurrent physical/psychological problem caused/exacerbated by use, Continued use despite persistent or recurrent social, interpersonal problems, caused or exacerbated by use, Evidence of tolerance, Evidence of withdrawal (Comment),  Persistent desire or unsuccessful efforts to cut down or control use, Presence of craving or strong urge to use, Recurrent use that results in a failure to fulfill major role obligations (work, school, home), Repeated use in physically hazardous situations  Recommendations for Services/Supports/Treatments: Recommendations for Services/Supports/Treatments Recommendations For Services/Supports/Treatments: Residential-Level 2, SAIOP (Substance Abuse Intensive Outpatient Program)  Discharge Disposition:    DSM5 Diagnoses: There are no problems to display for this patient.    Referrals to Alternative Service(s): Referred to Alternative Service(s):   Place:   Date:   Time:    Referred to Alternative Service(s):   Place:   Date:   Time:    Referred to Alternative Service(s):   Place:   Date:   Time:    Referred to Alternative Service(s):   Place:   Date:   Time:     Meryle Ready, Counselor

## 2021-07-13 NOTE — Progress Notes (Signed)
Per Ysidro Evert Bobbitt,NP, patient meets criteria for inpatient treatment. There are no available or appropriate beds at [facility] today. CSW faxed referrals to the following facilities for review:  [facility]  TTS will continue to seek bed placement.  Crissie Reese, MSW, LCSW-A, LCAS-A Phone: [phone] Disposition/TOC

## 2021-07-14 DIAGNOSIS — F1994 Other psychoactive substance use, unspecified with psychoactive substance-induced mood disorder: Secondary | ICD-10-CM

## 2021-07-14 DIAGNOSIS — F1120 Opioid dependence, uncomplicated: Secondary | ICD-10-CM

## 2021-07-14 MED ORDER — CLONIDINE HCL 0.1 MG PO TABS
0.1000 mg | ORAL_TABLET | Freq: Three times a day (TID) | ORAL | Status: DC | PRN
  Administered 2021-07-17: 0.1 mg via ORAL
  Filled 2021-07-14: qty 1

## 2021-07-14 MED ORDER — ACETAMINOPHEN 325 MG PO TABS
650.0000 mg | ORAL_TABLET | Freq: Four times a day (QID) | ORAL | Status: DC | PRN
  Administered 2021-07-14 – 2021-07-16 (×4): 650 mg via ORAL
  Filled 2021-07-14 (×3): qty 2

## 2021-07-14 MED ORDER — ACETAMINOPHEN 325 MG PO TABS
ORAL_TABLET | ORAL | Status: AC
  Filled 2021-07-14: qty 2

## 2021-07-14 MED ORDER — MIRTAZAPINE 7.5 MG PO TABS
7.5000 mg | ORAL_TABLET | Freq: Every day | ORAL | Status: DC
  Filled 2021-07-14 (×3): qty 1
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
## 2021-07-14 NOTE — BHH Group Notes (Signed)
[facility] LCSW Group Therapy Note  Date/Time:  07/14/2021 10:00-11:00AM  Type of Therapy and Topic:  Group Therapy:  Healthy and Unhealthy Supports  Participation Level:  Active   Description of Group:  Patients in this group were introduced to the idea of adding a variety of healthy supports to address the various needs in their lives.Patients discussed what additional healthy supports could be helpful in their recovery and wellness after discharge in order to prevent future hospitalizations.   An emphasis was placed on using counselor, doctor, therapy groups, 12-step groups, and problem-specific support groups to expand supports.  Several songs were played to emphasize points made throughout group.  Therapeutic Goals:   1)  discuss importance of adding supports to stay well once out of the hospital  2)  compare healthy versus unhealthy supports and identify some examples of each  3)  generate ideas and descriptions of healthy supports that can be added  4)  offer mutual support about how to address unhealthy supports  5)  encourage active participation in and adherence to discharge plan    Summary of Patient Progress:  The patient stated that current healthy supports in his life are his 2 chihuahua puppies, his girlfriend, and his parents while current unhealthy supports include himself.  The patient expressed a willingness to add support(s) to help in his recovery journey.  He was attentive and goal-directed throughout group.   Therapeutic Modalities:   Motivational Interviewing Brief Solution-Focused Therapy  Ambrose Mantle, LCSW

## 2021-07-14 NOTE — Progress Notes (Signed)
   07/14/21 2307  Psych Admission Type (Psych Patients Only)  Admission Status Voluntary  Psychosocial Assessment  Patient Complaints Substance abuse  Eye Contact Fair  Facial Expression Anxious  Affect Appropriate to circumstance  Speech Logical/coherent  Interaction Minimal  Motor Activity Fidgety;Restless  Appearance/Hygiene Unremarkable  Behavior Characteristics Appropriate to situation  Mood Anxious;Pleasant  Thought Process  Coherency WDL  Content WDL  Delusions None reported or observed  Perception WDL  Hallucination None reported or observed  Judgment Impaired  Confusion None  Danger to Self  Current suicidal ideation? Denies  Danger to Others  Danger to Others None reported or observed

## 2021-07-14 NOTE — BHH Suicide Risk Assessment (Signed)
[facility] Admission Suicide Risk Assessment   Nursing information obtained from:  Patient Demographic factors:  Male, Caucasian Current Mental Status:  SI on admission Loss Factors:  Decline in physical health Historical Factors:  Impulsivity Risk Reduction Factors:  Positive social support  Total Time Spent in Direct Patient Care:  I personally spent 40 minutes on the unit in direct patient care. The direct patient care time included face-to-face time with the patient, reviewing the patient's chart, communicating with other professionals, and coordinating care. Greater than 50% of this time was spent in counseling or coordinating care with the patient regarding goals of hospitalization, psycho-education, and discharge planning needs.  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 He was voluntarily admitted to [facility] due to reported SI.  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 He states he is currently on Methadone 175mg  daily as written by [facility] but has been attempting to wean himself down on the dose in the hopes that his orthopedic surgeon will approve for him to have another back surgery. He states his surgeon tells him that until he is off cigarettes and on a lower Methadone dose he will not qualify for the procedure. When asked why he has not told his methadone clinic that he needs to start tapering the dose, he admits he had planned to sell any left over methadone to supplement his income since he is unemployed. He has not sold any of his medications to date by his report. He is vague  as to what his daily Methadone dose has been over the last 3 months based on his own self-taper stating he "has lost track" of his daily dosing and often varies the dose. He has not had any methadone since last Thursday but is vague as to his current opiate withdrawal symptoms. He admits that in addition, he has been buying Xanax up to 4mg  daily off the streets and using daily Xanax for 10 years. When asked how he is passing his UDS at the methadone clinic he admits he uses someone else's urine samples at his appointments. He has not had any Xanax since Thursday and states he was having severe anxiety until started on a CIWA scheduled benzodiazepine taper on arrival to the unit. He states he feels guilty about his drug use and feels depressed in the context of belief that he has disappointed his parents. He is vague as to how long his depression has been present but states he has had insomnia for the last 20 days and low appetite. He is interested in an antidepressant trial and is not sure if he wants a residential rehab program or SAIOP for his addictions. He thinks he wants to remain on Methadone and work with the methadone clinic to gradually wean down on the dose but does want help coming off Xanax. He states he drinks small amounts about every 2 months and denies other recent illicit or prescription drug abuse. He denies h/o mania/hypomania. He states that when he is "sleep deprived" and detoxing he will sometimes see "sparklers" in his peripheral vision. He denies other psychotic symptoms, AVH, or paranoia. He states  that yesterday he had passive SI without a plan and denies current SI. He denies HI. He denies previous suicide attempts or psychiatric admissions. He denies previous prescribed psychotropic medication trials. See H&P for additional details.  Continued Clinical Symptoms:  Alcohol Use Disorder Identification Test Final Score (AUDIT): 0 The "Alcohol Use Disorders Identification Test", Guidelines  for Use in Primary Care, Second Edition.  World Science writer [facility]). Score between 0-7:  no or low risk or alcohol related problems. Score between 8-15:  moderate risk of alcohol related problems. Score between 16-19:  high risk of alcohol related problems. Score 20 or above:  warrants further diagnostic evaluation for alcohol dependence and treatment.  CLINICAL FACTORS:   Depression:   Hopelessness More than one psychiatric diagnosis  Musculoskeletal: Strength & Muscle Tone: within normal limits Gait & Station: normal, steady Patient leans: N/A  Psychiatric Specialty Exam: Physical Exam Vitals reviewed.  HENT:     Head: Normocephalic.  Pulmonary:     Effort: Pulmonary effort is normal.  Neurological:     General: No focal deficit present.     Mental Status: He is alert.    Review of Systems - see H&P  Blood pressure 126/90, pulse (!) 115, temperature 99.1 F (37.3 C), temperature source Oral, resp. rate 18, height 5\' 6"  (1.676 m), weight 83 kg, SpO2 99 %.Body mass index is 29.54 kg/m.  General Appearance:  adequate hygiene, casually dressed  Eye Contact:  Fair  Speech:  Clear and Coherent and Normal Rate  Volume:  Normal  Mood:  Anxious and Dysphoric  Affect:  anxious and at times tearful  Thought Process:  Goal Directed and Linear  Orientation:  Full (Time, Place, and Person)  Thought Content:  Logical and no evidence of acute psychosis, delusions, or paranoia on exam  Suicidal Thoughts:   passive SI prior to admission - denies current SI, intent or plan  Homicidal Thoughts:  No  Memory:  Recent;   Good  Judgement:  Impaired  Insight:  Lacking  Psychomotor Activity:   fidgety during exam  Concentration:  Concentration: Fair and Attention Span: Fair  Recall:  Good  Fund of Knowledge:  Fair  Language:  Good  Akathisia:  Negative  Assets:  Communication Skills Desire for Improvement Resilience Social Support  ADL's:  Intact  Cognition:  WNL  Sleep:  Number  of Hours: 6.75   COGNITIVE FEATURES THAT CONTRIBUTE TO RISK:  Thought constriction (tunnel vision)    SUICIDE RISK:  Mild - no current SI, intent or plan and no previous suicide attempts  PLAN OF CARE: Patient admitted voluntarily to [facility]. Admission labs reviewed: WBC 7.5, H/H 13.9/40, platelets 258, Troponin I 6, CMP WNL except for AST 142 and ALT 204; ETOH <10, UDS positive for benzodiazepines; Tylenol <10, Salicylate <7, Respiratory panel negative; A1c, lipid, TSH pending. Will order Hepatitis panel and repeat Hepatic function panel. Will inquire if patient would consent to HIV testing given past IVD history. He has been placed on a scheduled benzodiazepine taper with CIWA monitoring for benzo withdrawal and on PRNS for potential opiate withdrawal. We will attempt to contact his Methadone clinic tomorrow to verify his Methadone dosing. I discussed options of antidepressant options with the patient and he agrees to consider and discuss with resident physician further. I encouraged him to consider residential rehab/SAIOP after discharge.   I certify that inpatient services furnished can reasonably be expected to improve the patient's condition.   12-18-2007, MD, FAPA 07/14/2021, 7:26  AM

## 2021-07-14 NOTE — Progress Notes (Signed)
Adult Psychoeducational Group Note  Date:  07/14/2021 Time:  10:51 PM  Group Topic/Focus:  Wrap-Up Group:   The focus of this group is to help patients review their daily goal of treatment and discuss progress on daily workbooks.  Participation Level:  Active  Participation Quality:  Appropriate  Affect:  Appropriate  Cognitive:  Appropriate  Insight: Appropriate  Engagement in Group:  Developing/Improving  Modes of Intervention:  Discussion  Additional Comments:  Pt stated his goal for today was to focus on his treatment plan and attend all groups held. Pt stated he accomplished his goals today. Pt stated he did not talked with his doctor or his social worker about his care today. Pt rated his overall day a 5 out of 10. Pt stated he made no calls today. Pt stated he felt better about himself today. Pt stated he was able to attend all meals. Pt stated he took all medications provided today. Pt stated he attend all groups held today. Pt stated his appetite was improved today. Pt rated sleep last night was fair. Pt stated the goal tonight was to get some rest. Pt stated he had some physical pain tonight. Pt stated he had some moderate pain in his stomach tonight. Pt rated the moderate pain in his stomach a 6 on the pain level scale. Pt deny visual hallucinations and auditory issues tonight. Pt denies thoughts of harming himself or others. Pt stated he would alert staff if anything changed.  Felipa Furnace 07/14/2021, 10:51 PM

## 2021-07-14 NOTE — Progress Notes (Signed)
   07/14/21 1130  Psych Admission Type (Psych Patients Only)  Admission Status Voluntary  Psychosocial Assessment  Patient Complaints Anxiety;Depression  Eye Contact Fair  Facial Expression Flat  Affect Appropriate to circumstance  Speech Logical/coherent  Interaction Minimal  Motor Activity Fidgety  Appearance/Hygiene Unremarkable  Behavior Characteristics Cooperative;Anxious  Mood Depressed;Anxious  Thought Process  Coherency WDL  Content WDL  Delusions None reported or observed  Perception WDL  Hallucination None reported or observed  Judgment Impaired  Confusion None  Danger to Self  Current suicidal ideation? Denies  Danger to Others  Danger to Others None reported or observed

## 2021-07-15 DIAGNOSIS — F1994 Other psychoactive substance use, unspecified with psychoactive substance-induced mood disorder: Secondary | ICD-10-CM | POA: Diagnosis present

## 2021-07-15 DIAGNOSIS — F1120 Opioid dependence, uncomplicated: Secondary | ICD-10-CM | POA: Diagnosis present

## 2021-07-15 DIAGNOSIS — F1310 Sedative, hypnotic or anxiolytic abuse, uncomplicated: Secondary | ICD-10-CM | POA: Diagnosis present

## 2021-07-15 LAB — HEPATITIS PANEL, ACUTE
HCV Ab: NONREACTIVE
Hep A IgM: NONREACTIVE
Hep B C IgM: NONREACTIVE
Hepatitis B Surface Ag: NONREACTIVE

## 2021-07-15 LAB — HEPATIC FUNCTION PANEL
ALT: 177 U/L — ABNORMAL HIGH (ref 0–44)
AST: 88 U/L — ABNORMAL HIGH (ref 15–41)
Albumin: 4.3 g/dL (ref 3.5–5.0)
Alkaline Phosphatase: 67 U/L (ref 38–126)
Bilirubin, Direct: 0.2 mg/dL (ref 0.0–0.2)
Indirect Bilirubin: 0.6 mg/dL (ref 0.3–0.9)
Total Bilirubin: 0.8 mg/dL (ref 0.3–1.2)
Total Protein: 7.3 g/dL (ref 6.5–8.1)

## 2021-07-15 LAB — LIPID PANEL
Cholesterol: 180 mg/dL (ref 0–200)
HDL: 33 mg/dL — ABNORMAL LOW (ref >40)
LDL Cholesterol: 129 mg/dL — ABNORMAL HIGH (ref 0–99)
Total CHOL/HDL Ratio: 5.5 RATIO
Triglycerides: 90 mg/dL (ref <150)
VLDL: 18 mg/dL (ref 0–40)

## 2021-07-15 LAB — TSH: TSH: 0.677 u[IU]/mL (ref 0.350–4.500)

## 2021-07-15 MED ORDER — MIRTAZAPINE 15 MG PO TABS
15.0000 mg | ORAL_TABLET | Freq: Every day | ORAL | Status: DC
  Administered 2021-07-15 – 2021-07-17 (×3): 15 mg via ORAL
  Filled 2021-07-15 (×3): qty 1
  Filled 2021-07-15: qty 7
  Filled 2021-07-15 (×3): qty 1

## 2021-07-15 MED ORDER — LORAZEPAM 1 MG PO TABS
1.0000 mg | ORAL_TABLET | Freq: Four times a day (QID) | ORAL | Status: DC | PRN
  Administered 2021-07-16 – 2021-07-18 (×3): 1 mg via ORAL
  Filled 2021-07-15 (×3): qty 1

## 2021-07-15 NOTE — Progress Notes (Signed)
Recreation Therapy Notes  Date:  7.25.22 Time: 0930 Location: 300 Hall Dayroom  Group Topic: Stress Management  Goal Area(s) Addresses:  Patient will identify positive stress management techniques. Patient will identify benefits of using stress management post d/c.  Intervention: Stress Management  Activity : Meditation.  LRT played a meditation that focused on being indecisive.  Meditation talked about being okay with the decisions you make even if they turn out to be the wrong decision, there is still room to learn from them.   Education:  Stress Management, Discharge Planning.   Education Outcome: Acknowledges Education  Clinical Observations/Feedback: Pt did not attend group session.    Caroll Rancher, LRT/CTRS         Caroll Rancher A 07/15/2021 12:01 PM

## 2021-07-15 NOTE — Progress Notes (Signed)
D:  Patient's self inventory sheet, patient has poor sleep, no sleep medication.  Good appetite, low energy level, good concentration.  Rated depression, hopeless and anxiety 6.  Withdrawals, tremors, diarrhea, cramping, agitation, irritability.  Denied SI.  Denied ph ysical problems, then checked lightheaded, pain, dizzy, headaches, blurred vision.  Physical pain, worst pain #9 in past 24 hours.  Wants to find out the I am I used to be.  Plans to listen to all rules and do what is asked of him.  No discharge plans. A:  Medications administered per MD orders.  Emotional support and encouragement given patient. R:  Denied SI and HI, contracts for safety.  Denied A/V hallucinations.  Safety maintained with 15 minute checks.

## 2021-07-15 NOTE — Progress Notes (Addendum)
[facility] MD Progress Note  07/15/2021 2:40 PM SONIA STICKELS  MRN:  [id number] Subjective:   El Pile is a 43 YOM with opioid use disorder who presents voluntarily for depression and passive SI.  24 hr events: no documented behavioral issues, no PRN medications given for agitation. Pt received Ativan 1 mg x2 over the past 24 hrs. Patient did not meet CIWA requirements for administration, no documentation regarding why the medication was given  On interview this morning patient reports his mood as "okay". He reports feeling numb emotionally and says that he is trying to find himself. When challenged to engage in goal directed thinking patient obliges and says that he can try to get out of bed and also try journaling today. He reports wanting to leave the hospital by Tuesday morning so that he can make it to his Methadone clinic to avoid getting "restarted", however, patient is amenable to continued stay. He reports a desire to restart methadone and was hoping to receive that during this hospitalization. He is accepting when told that this will not happen. He denies SI/HI/AVH, but does report Nausea on ROS.   Total Time Spent in Direct Patient Care: I personally spent 30 minutes on the unit in direct patient care. The direct patient care time included face-to-face time with the patient, reviewing the patient's chart, communicating with other professionals, and coordinating care. Greater than 50% of this time was spent in counseling or coordinating care with the patient regarding goals of hospitalization, psycho-education, and discharge planning needs.   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 No pertinent past medical history.   Past Surgical History:  Procedure Laterality Date   BACK SURGERY     Family History: History reviewed. No pertinent family history. Family Psychiatric  History: pt denies relevant Hx Social History:   15 py smoking Hx  Additional Social History:   Marital status: Long term relationship Long term relationship, how long?: since 2005 What types of issues is patient dealing with in the relationship?: Reports stress in relationship with girlfriend. she doesn't understand his mental health and withdrawl struggles. 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   Sleep: Poor, however, patient is found in bed during daytime  Appetite:  Fair  Current Medications: Current Facility-Administered Medications  Medication Dose Route Frequency Provider Last Rate Last Admin   acetaminophen (TYLENOL) tablet 650 mg  650 mg Oral Q6H PRN Laveda Abbe, NP   650 mg at 07/15/21 0755   cloNIDine (CATAPRES) tablet 0.1 mg  0.1 mg Oral Q8H PRN Comer Locket, MD       dicyclomine (BENTYL) tablet 20 mg  20 mg Oral Q6H PRN Laveda Abbe, NP   20 mg at 07/14/21 2113   hydrOXYzine (ATARAX/VISTARIL) tablet 25 mg  25 mg Oral Q6H PRN Laveda Abbe, NP   25 mg at 07/14/21 2113   loperamide (IMODIUM) capsule 2-4 mg  2-4 mg Oral PRN Laveda Abbe, NP       LORazepam (ATIVAN) tablet 1 mg  1 mg Oral TID Laveda AbbeParks, Laurie Britton, NP   1 mg at 07/15/21 1258   Followed by   Melene Muller[START ON 07/16/2021] LORazepam (ATIVAN) tablet 1 mg  1 mg Oral BID Laveda AbbeParks, Laurie Britton, NP       Followed by   Melene Muller[START ON 07/17/2021] LORazepam (ATIVAN) tablet 1 mg  1 mg Oral Daily Laveda AbbeParks, Laurie Britton, NP       LORazepam (ATIVAN) tablet 1 mg  1 mg Oral  Q6H PRN Carlyn ReichertGabrielle, Nick, MD       methocarbamol (ROBAXIN) tablet 500 mg  500 mg Oral Q8H PRN Laveda AbbeParks, Laurie Britton, NP   500 mg at 07/14/21 2113   mirtazapine (REMERON) tablet 7.5 mg  7.5 mg Oral QHS Carlyn ReichertGabrielle, Nick, MD       multivitamin with minerals tablet 1 tablet  1 tablet Oral Daily Laveda AbbeParks, Laurie Britton, NP   1 tablet at 07/15/21 [id number]   naproxen (NAPROSYN) tablet 500 mg  500 mg Oral BID PRN Laveda AbbeParks, Laurie Britton, NP   500 mg at 07/13/21 2125   nicotine (NICODERM CQ - dosed in mg/24 hours) patch 14 mg  14 mg Transdermal Daily Laveda AbbeParks, Laurie Britton, NP   14 mg at 07/15/21 0751   ondansetron (ZOFRAN-ODT) disintegrating tablet 4 mg  4 mg Oral Q6H PRN Laveda AbbeParks, Laurie Britton, NP   4 mg at 07/14/21 [id number]   thiamine tablet 100 mg  100 mg Oral Daily Laveda AbbeParks, Laurie Britton, NP   100 mg at 07/15/21 [id number]    Lab Results:  Results for orders placed or performed during the hospital encounter of 07/13/21 (from the past 48 hour(s))  Hepatitis panel, acute     Status: None   Collection Time: 07/15/21  6:26 AM  Result Value Ref Range   Hepatitis B Surface Ag NON REACTIVE NON REACTIVE   HCV Ab NON REACTIVE NON REACTIVE    Comment: (NOTE) Nonreactive HCV antibody screen is consistent with no HCV infections,  unless recent infection is suspected or other evidence exists to indicate HCV infection.     Hep A IgM NON REACTIVE NON REACTIVE   Hep B C IgM NON REACTIVE NON REACTIVE    Comment: Performed at [facility], [address] [place], [place] [address]  Hepatic function panel     Status: Abnormal   Collection Time: 07/15/21  6:26 AM  Result Value Ref Range   Total Protein 7.3 6.5 - 8.1 g/dL   Albumin 4.3 3.5 - 5.0 g/dL   AST 88 (H) 15 - 41 U/L   ALT 177 (H) 0 - 44 U/L   Alkaline Phosphatase 67 38 - 126 U/L   Total Bilirubin 0.8 0.3 - 1.2 mg/dL   Bilirubin, Direct 0.2 0.0 - 0.2 mg/dL   Indirect Bilirubin 0.6 0.3 - 0.9 mg/dL    Comment: Performed at [facility], [address] [place], [place] [address]  TSH     Status: None   Collection Time: 07/15/21  6:26 AM  Result Value Ref Range   TSH 0.677 0.350 - 4.500 uIU/mL    Comment: Performed by a 3rd Generation assay with a functional sensitivity of <=0.01 uIU/mL. Performed at [facility], [address] [place], [place] [address]   Lipid panel     Status: Abnormal   Collection Time: 07/15/21  6:26 AM  Result Value Ref Range   Cholesterol 180 0 - 200 mg/dL   Triglycerides 90 <474<150 mg/dL   HDL 33 (  L) >40 mg/dL   Total CHOL/HDL Ratio 5.5 RATIO   VLDL 18 0 - 40 mg/dL   LDL Cholesterol 149 (H) 0 - 99 mg/dL    Comment:        Total Cholesterol/HDL:CHD Risk Coronary Heart Disease Risk Table                     Men   Women  1/2 Average Risk   3.4   3.3  Average Risk       5.0   4.4  2 X Average Risk   9.6   7.1  3 X Average Risk  23.4   11.0        Use the calculated Patient Ratio above and the CHD Risk Table to determine the patient's CHD Risk.        ATP III CLASSIFICATION (LDL):  <100     mg/dL   Optimal  702-637  mg/dL   Near or Above                    Optimal  130-159  mg/dL   Borderline  858-850  mg/dL   High  >277     mg/dL   Very High Performed at [facility], [address] [place], [place] [address]     Blood Alcohol level:  Lab Results  Component Value Date   ETH <10 07/13/2021    Metabolic Disorder Labs: No results found for: HGBA1C, MPG No results found for: PROLACTIN Lab Results  Component Value Date   CHOL 180 07/15/2021   TRIG 90 07/15/2021   HDL 33 (L) 07/15/2021   CHOLHDL 5.5 07/15/2021   VLDL 18 07/15/2021   LDLCALC 129 (H) 07/15/2021    Physical Findings: AIMS: NA CIWA:  CIWA-Ar Total: 7 COWS:  COWS Total Score: 10  Musculoskeletal: Strength & Muscle Tone:  untested Gait & Station: normal, steady Patient leans: Front  Psychiatric Specialty Exam:  Presentation  General Appearance: Appropriate for  Environment  Eye Contact:Poor  Speech:Clear and Coherent  Speech Volume:Normal  Handedness:Right   Mood and Affect  Mood:-- ("okay")  Affect:Depressed   Thought Process  Thought Processes:Coherent, linear  Descriptions of Associations:Intact  Orientation:Full (Time, Place and Person)  Thought Content:Logical - no evidence of acute psychosis on exam and denies AVH, paranoia, or delusions  History of Schizophrenia/Schizoaffective disorder:No  Duration of Psychotic Symptoms:No data recorded Hallucinations:Hallucinations: None  Ideas of Reference:None  Suicidal Thoughts:Suicidal Thoughts: No  Homicidal Thoughts:Homicidal Thoughts: No   Sensorium  Memory:Immediate Good; Recent Good; Remote Good  Judgment:Fair  Insight:Fair   Executive Functions  Concentration:Good  Attention Span:Good  Recall:Good  Fund of Knowledge:Good  Language:Good   Psychomotor Activity  Psychomotor Activity:Psychomotor Activity: Normal   Assets  Assets:Housing   Sleep  Sleep:Sleep: Poor Number of Hours of Sleep: 3.75    Physical Exam: Physical Exam Vitals reviewed.  HENT:     Head: Normocephalic and atraumatic.  Pulmonary:     Effort: Pulmonary effort is normal.  Neurological:     General: No focal deficit present.     Mental Status: He is alert and oriented to person, place, and time.   Review of Systems  Respiratory:  Negative for shortness of breath.   Cardiovascular:  Negative for chest pain.  Gastrointestinal:  Positive for nausea. Negative for vomiting.  Neurological:  Negative for headaches.  Blood pressure 121/85, pulse (!) 103, temperature 97.9 F (36.6 C), resp. rate 18, height 5'  6" (1.676 m), weight 83 kg, SpO2 100 %. Body mass index is 29.54 kg/m.   Treatment Plan Summary: Daily contact with patient to assess and evaluate symptoms and progress in treatment and Medication management   Safety and Monitoring --VOLUNTARY admission to inpatient  psychiatric unit for safety, stabilization and treatment -- Daily contact with patient to assess and evaluate symptoms and progress in treatment -- Patient's case to be discussed in multi-disciplinary team meeting -- Observation Level : q15 minute checks -- Vital signs:  q12 hours -- Precautions: suicide   Substance Induced depressive disorder -Encourage abstinence from prescription pill abuse -Motivational interviewing -Increase Remeron to 15mg  qhs for residual depressive symptoms             -pt counseled on r/b/se of medication   Opioid use d/o on MAT Sedative/hypnotic/anxiolytic use d/o Patient likely experienced significant benzo w/d -Educate patient regarding abrupt discontinuation of benzos and methadone -COWS scoring but not restarting methadone since he has been off medication prior to admission and is unclear about recent self-adjusted dosing - he can re-establish with methadone clinic after discharge if desired -Clonidine 0.1 mg PRN for elevated BP -Symptomatic opioid w/d treatment: PRN Robaxin, Bentyl, Imodium, Zofran, Naproxen -CIWA: 1 mg Ativan q6hr PRN for score >10 -Ativan taper 1 mg qid->tid->bid->qd -multivitamin with thiamine - encouraged consideration of residential rehab or SAIOP and he will consider   Medical Management EKG with NSR, Qtc 469 CBC unremarkable CMP with elevated LFTs AST/ALT of 142/204, no stigmata of liver dz             -Repeat 7/25 of 88/177 , downtrending but still elevated             -Hepatitis panel negative   -CTM with repeat LFTs on 7/26 - will need outpatient f/u with PCP for w/u after discharge UDS with benzos EtOH <10 TSH: 0.68 Lipids: HDL 33, LDL 129, otherwise wnl A1c: pending  Continue PRN's: Tylenol, Maalox, Atarax, Milk of Magnesia, Trazodone  8/26 PGY-1, Psychiatry

## 2021-07-15 NOTE — BHH Group Notes (Signed)
Type of Therapy and Topic: Group Therapy: Anger Management   Participation Level:  Active  Description of Group: In this group, patients will learn helpful strategies and techniques to manage anger, express anger in alternative ways, change hostile attitudes, and prevent aggressive acts, such as verbal abuse and violence.This group will be process-oriented and eductional, with patients participating in exploration of their own experiences as well as giving and receiving support and challenge from other group members.  Therapeutic Goals: Patient will learn to manage anger. Patient will learn to stop violence or the threat of violence. Patient will learn to develop self control over thoughts and actions. Patient will receive support and feedback from others  Summary of Patient Progress:Pt shared that triggers for him are people who cannot drive.  Pt's warning signs are being becoming physically hot and nervousness. Pt would like to discuss controlling his emotions during another group.   Therapeutic Modalities: Cognitive Behavioral Therapy Solution Focused Therapy Motivational Interviewing  Fredirick Lathe, LCSWA Clinicial Social Worker [employer]

## 2021-07-15 NOTE — Tx Team (Signed)
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[place]) Active Problems:   Benzodiazepine abuse ([place])   Opiate dependence ([address])   Current Medications:  Current Facility-Administered Medications  Medication Dose Route Frequency Provider Last Rate Last Admin   acetaminophen (TYLENOL) tablet 650 mg  650 mg Oral Q6H PRN Ethelene Hal, NP   650 mg at 07/15/21 0755   cloNIDine (CATAPRES) tablet 0.1 mg  0.1 mg Oral Q8H PRN Harlow Asa, MD       dicyclomine (BENTYL) tablet 20 mg  20 mg Oral Q6H PRN Ethelene Hal, NP   20 mg at 07/14/21 2113   hydrOXYzine (ATARAX/VISTARIL) tablet 25 mg  25 mg Oral Q6H PRN Ethelene Hal, NP   25 mg at 07/14/21 2113   loperamide (IMODIUM) capsule 2-4 mg  2-4 mg Oral PRN Ethelene Hal, NP       LORazepam (ATIVAN) tablet 1 mg  1 mg Oral TID Ethelene Hal, NP   1 mg at 07/15/21 8469   Followed by   Derrill Memo ON 07/16/2021] LORazepam (ATIVAN) tablet 1 mg  1 mg Oral BID Ethelene Hal, NP       Followed by   Derrill Memo ON 07/17/2021] LORazepam (ATIVAN) tablet 1 mg  1 mg Oral Daily Ethelene Hal, NP       LORazepam (ATIVAN) tablet 1 mg  1 mg Oral Q6H PRN Corky Sox, MD       methocarbamol (ROBAXIN) tablet 500 mg  500 mg Oral Q8H PRN Ethelene Hal, NP   500 mg at 07/14/21 2113   mirtazapine (REMERON) tablet 7.5 mg  7.5 mg Oral QHS Corky Sox, MD       multivitamin with minerals tablet 1 tablet  1 tablet Oral Daily Ethelene Hal, NP   1 tablet at 07/15/21 6295   naproxen (NAPROSYN) tablet 500 mg  500 mg Oral BID PRN Ethelene Hal, NP   500 mg at 07/13/21 2125   nicotine (NICODERM CQ - dosed in mg/24 hours) patch 14 mg  14 mg Transdermal Daily  Ethelene Hal, NP   14 mg at 07/15/21 0751   ondansetron (ZOFRAN-ODT) disintegrating tablet 4 mg  4 mg Oral Q6H PRN Ethelene Hal, NP   4 mg at 07/14/21 2841   thiamine tablet 100 mg  100 mg Oral Daily Ethelene Hal, NP   100 mg at 07/15/21 3244   PTA Medications: Medications Prior to Admission  Medication Sig Dispense Refill Last Dose   METHADONE HCL PO Take by mouth daily.       Patient Stressors: Health problems Marital or family conflict Substance abuse  Patient Strengths: Ability for insight Communication skills Supportive family/friends  Treatment Modalities: Medication Management, Group therapy, Case management,  1 to 1 session with clinician, Psychoeducation, Recreational therapy.   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  Therapeutic Interventions: 1 to 1 sessions, Unit Group sessions and Medication administration.  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[place]) Active Problems:   Benzodiazepine abuse ([place])   Opiate dependence ([place])  Long Term Goal(s): Improvement in symptoms so as ready for discharge   Short Term Goals: Ability to demonstrate self-control will improve Ability to identify and develop  effective coping behaviors will improve Ability to maintain clinical measurements within normal limits will improve Ability to identify changes in lifestyle to reduce recurrence of condition will improve Ability to verbalize feelings will improve Ability to disclose and discuss suicidal ideas     Medication Management: Evaluate patient's response, side effects, and tolerance of medication regimen.  Therapeutic Interventions: 1 to 1 sessions, Unit Group sessions and Medication administration.  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 RN will report any adverse and/or side effects to prescribing provider.  Therapeutic Interventions: 1 on 1 counseling sessions, Psychoeducation, Medication administration, Evaluate responses to treatment, Monitor vital signs and CBGs as ordered, Perform/monitor CIWA, COWS, AIMS and Fall Risk screenings as ordered, Perform wound care treatments as ordered.  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  Short Term Goals: Engage patient in aftercare planning  with referrals and resources, Increase social support, Increase ability to appropriately verbalize feelings, Increase emotional regulation, Identify triggers associated with mental health/substance abuse issues, and Increase skills for wellness and recovery  Therapeutic Interventions: Assess for all discharge needs, 1 to 1 time with Social worker, Explore available resources and support systems, Assess for adequacy in community support network, Educate family and significant other(s) on suicide prevention, Complete Psychosocial Assessment, Interpersonal group therapy.  Evaluation of Outcomes: Not Met   Progress in Treatment: Attending groups: Yes. Participating in groups: Yes. Taking medication as prescribed: Yes. Toleration medication: Yes. Family/Significant other contact made: No, will contact:  mother Patient understands diagnosis: Yes. Discussing patient identified problems/goals with staff: Yes. Medical problems stabilized or resolved: Yes. Denies suicidal/homicidal ideation: Yes. Issues/concerns per patient self-inventory: No.   New problem(s) identified: No, Describe:  none  New Short Term/Long Term Goal(s): detox, medication management for mood stabilization; elimination of SI thoughts; development of comprehensive mental wellness/sobriety plan   Patient Goals:  "To get back to reality"  Discharge Plan or Barriers: Patient recently admitted. CSW will continue to follow and assess for appropriate referrals and possible discharge planning.    Reason for Continuation of Hospitalization: Depression Medication stabilization Suicidal ideation Withdrawal symptoms  Estimated Length of Stay: 3-5 days  Attendees: Patient: Douglas Long 07/15/2021   Physician: Fatima Sanger, DO 07/15/2021   Nursing:  07/15/2021   RN Care  Manager: 07/15/2021   Social Worker: Darletta Moll, LCSW 07/15/2021   Recreational Therapist:  07/15/2021  Other:  07/15/2021   Other:  07/15/2021   Other:  07/15/2021     Scribe for Treatment Team: Vassie Moselle, LCSW 07/15/2021 12:46 PM

## 2021-07-16 LAB — COMPREHENSIVE METABOLIC PANEL
ALT: 181 U/L — ABNORMAL HIGH (ref 0–44)
AST: 91 U/L — ABNORMAL HIGH (ref 15–41)
Albumin: 4.4 g/dL (ref 3.5–5.0)
Alkaline Phosphatase: 69 U/L (ref 38–126)
Anion gap: 8 (ref 5–15)
BUN: 10 mg/dL (ref 6–20)
CO2: 25 mmol/L (ref 22–32)
Calcium: 9.4 mg/dL (ref 8.9–10.3)
Chloride: 107 mmol/L (ref 98–111)
Creatinine, Ser: 0.93 mg/dL (ref 0.61–1.24)
GFR, Estimated: >60 mL/min (ref >60)
Glucose, Bld: 106 mg/dL — ABNORMAL HIGH (ref 70–99)
Potassium: 3.7 mmol/L (ref 3.5–5.1)
Sodium: 140 mmol/L (ref 135–145)
Total Bilirubin: 0.7 mg/dL (ref 0.3–1.2)
Total Protein: 7.4 g/dL (ref 6.5–8.1)

## 2021-07-16 LAB — HEMOGLOBIN A1C
Hgb A1c MFr Bld: 6.7 % — ABNORMAL HIGH (ref 4.8–5.6)
Mean Plasma Glucose: 146 mg/dL

## 2021-07-16 LAB — GLUCOSE, CAPILLARY: Glucose-Capillary: 89 mg/dL (ref 70–99)

## 2021-07-16 NOTE — Progress Notes (Addendum)
[facility] MD Progress Note  07/16/2021 12:51 PM Douglas Long  MRN:  [id number] Subjective:   Douglas Long is a 41 YOM with opioid use disorder who presents voluntarily for depression and passive SI.  24 hr events: no documented behavioral issues, no PRN medications given for agitation.  On interview this morning, patient's thought process and affect is unchanged from previous days. He reports his mood as depressed. He desires to leave the hospital as soon as possible to re-establish care with his methadone clinic. He reports a desire to stay away from Xanax. When asked to explain why he desires to quit, patient replies "I want to stop chasing my own tail". With regard to withdrawal symptoms, the patient reports feeling well physically. He denies nausea, HA, myalgias, and denies diarrhea, saying that he is actually slightly constipated. It is also notable that patient has organized his room neatly. Patient reports that this is an enjoyable activity for him. He denies SI, HI, and AVH.  Total Time Spent in Direct Patient Care: I personally spent 30 minutes on the unit in direct patient care. The direct patient care time included face-to-face time with the patient, reviewing the patient's chart, communicating with other professionals, and coordinating care. Greater than 50% of this time was spent in counseling or coordinating care with the patient regarding goals of hospitalization, psycho-education, and discharge planning needs.   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 No pertinent past medical history.  Past Surgical History:  Procedure Laterality Date   BACK SURGERY     Family History: History reviewed. No pertinent family  history. Family Psychiatric  History: pt denies relevant Hx Social History:   15 py smoking Hx  Additional Social History:   Marital status: Long term relationship Long term relationship, how long?: since 2005 What types of issues is patient dealing with in the relationship?: Reports stress in relationship with girlfriend. she doesn't understand his mental health and withdrawl struggles. 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   Sleep: Poor, however, patient is found in bed during daytime  Appetite:  Fair  Current Medications: Current Facility-Administered Medications  Medication Dose Route Frequency Provider Last Rate Last Admin   acetaminophen (TYLENOL) tablet 650 mg  650 mg Oral Q6H PRN Laveda Abbe, NP   650 mg at 07/15/21 0755   cloNIDine (CATAPRES) tablet 0.1 mg  0.1 mg Oral Q8H PRN Comer Locket, MD       dicyclomine (BENTYL) tablet 20 mg  20 mg Oral Q6H PRN Laveda Abbe, NP   20 mg at 07/14/21 2113   hydrOXYzine (ATARAX/VISTARIL) tablet 25 mg  25 mg Oral Q6H PRN Laveda Abbe, NP   25 mg at 07/14/21 2113   loperamide (IMODIUM) capsule 2-4 mg  2-4 mg Oral PRN Laveda Abbe, NP       LORazepam (ATIVAN) tablet 1 mg  1 mg Oral BID Laveda Abbe, NP   1 mg at 07/16/21 0735   Followed by   Melene Muller ON 07/17/2021] LORazepam (  ATIVAN) tablet 1 mg  1 mg Oral Daily Laveda Abbe, NP       LORazepam (ATIVAN) tablet 1 mg  1 mg Oral Q6H PRN Carlyn Reichert, MD       methocarbamol (ROBAXIN) tablet 500 mg  500 mg Oral Q8H PRN Laveda Abbe, NP   500 mg at 07/16/21 0735   mirtazapine (REMERON) tablet 15 mg  15 mg Oral QHS Comer Locket, MD   15 mg at 07/15/21  2112   multivitamin with minerals tablet 1 tablet  1 tablet Oral Daily Laveda Abbe, NP   1 tablet at 07/16/21 0732   naproxen (NAPROSYN) tablet 500 mg  500 mg Oral BID PRN Laveda Abbe, NP   500 mg at 07/16/21 0736   nicotine (NICODERM CQ - dosed in mg/24 hours) patch 14 mg  14 mg Transdermal Daily Laveda Abbe, NP   14 mg at 07/16/21 0733   ondansetron (ZOFRAN-ODT) disintegrating tablet 4 mg  4 mg Oral Q6H PRN Laveda Abbe, NP   4 mg at 07/15/21 1521   thiamine tablet 100 mg  100 mg Oral Daily Laveda Abbe, NP   100 mg at 07/16/21 4496    Lab Results:  Results for orders placed or performed during the hospital encounter of 07/13/21 (from the past 48 hour(s))  Hepatitis panel, acute     Status: None   Collection Time: 07/15/21  6:26 AM  Result Value Ref Range   Hepatitis B Surface Ag NON REACTIVE NON REACTIVE   HCV Ab NON REACTIVE NON REACTIVE    Comment: (NOTE) Nonreactive HCV antibody screen is consistent with no HCV infections,  unless recent infection is suspected or other evidence exists to indicate HCV infection.     Hep A IgM NON REACTIVE NON REACTIVE   Hep B C IgM NON REACTIVE NON REACTIVE    Comment: Performed at [facility], [address] [place], [place] [address]  Hepatic function panel     Status: Abnormal   Collection Time: 07/15/21  6:26 AM  Result Value Ref Range   Total Protein 7.3 6.5 - 8.1 g/dL   Albumin 4.3 3.5 - 5.0 g/dL   AST 88 (H) 15 - 41 U/L   ALT 177 (H) 0 - 44 U/L   Alkaline Phosphatase 67 38 - 126 U/L   Total Bilirubin 0.8 0.3 - 1.2 mg/dL   Bilirubin, Direct 0.2 0.0 - 0.2 mg/dL   Indirect Bilirubin 0.6 0.3 - 0.9 mg/dL    Comment: Performed at [facility], [address] [place], [place] [address]  TSH     Status: None   Collection Time: 07/15/21  6:26 AM  Result Value Ref Range   TSH 0.677 0.350 - 4.500 uIU/mL    Comment: Performed by a 3rd Generation assay with a functional  sensitivity of <=0.01 uIU/mL. Performed at [facility], [address] [place], [place] [address]   Hemoglobin A1c     Status: Abnormal   Collection Time: 07/15/21  6:26 AM  Result Value Ref Range   Hgb A1c MFr Bld 6.7 (H) 4.8 - 5.6 %    Comment: (NOTE)         Prediabetes: 5.7 - 6.4         Diabetes: >6.4         Glycemic control for adults with diabetes: <7.0    Mean Plasma Glucose 146 mg/dL    Comment: (  NOTE) Performed At: [facility] [address] [place], [place] [id number] Jolene Schimke MD NW:[id number]   Lipid panel     Status: Abnormal   Collection Time: 07/15/21  6:26 AM  Result Value Ref Range   Cholesterol 180 0 - 200 mg/dL   Triglycerides 90 <578 mg/dL   HDL 33 (L) >46 mg/dL   Total CHOL/HDL Ratio 5.5 RATIO   VLDL 18 0 - 40 mg/dL   LDL Cholesterol 962 (H) 0 - 99 mg/dL    Comment:        Total Cholesterol/HDL:CHD Risk Coronary Heart Disease Risk Table                     Men   Women  1/2 Average Risk   3.4   3.3  Average Risk       5.0   4.4  2 X Average Risk   9.6   7.1  3 X Average Risk  23.4   11.0        Use the calculated Patient Ratio above and the CHD Risk Table to determine the patient's CHD Risk.        ATP III CLASSIFICATION (LDL):  <100     mg/dL   Optimal  952-841  mg/dL   Near or Above                    Optimal  130-159  mg/dL   Borderline  324-401  mg/dL   High  >027     mg/dL   Very High Performed at [facility], [address] [place], [place] [address]   Comprehensive metabolic panel     Status: Abnormal   Collection Time: 07/16/21  6:09 AM  Result Value Ref Range   Sodium 140 135 - 145 mmol/L   Potassium 3.7 3.5 - 5.1 mmol/L   Chloride 107 98 - 111 mmol/L   CO2 25 22 - 32 mmol/L   Glucose, Bld 106 (H) 70 - 99 mg/dL    Comment: Glucose reference range applies only to samples taken after fasting for at least 8 hours.   BUN 10 6 - 20 mg/dL   Creatinine, Ser 4.40 0.61 - 1.24 mg/dL    Calcium 9.4 8.9 - 34.7 mg/dL   Total Protein 7.4 6.5 - 8.1 g/dL   Albumin 4.4 3.5 - 5.0 g/dL   AST 91 (H) 15 - 41 U/L   ALT 181 (H) 0 - 44 U/L   Alkaline Phosphatase 69 38 - 126 U/L   Total Bilirubin 0.7 0.3 - 1.2 mg/dL   GFR, Estimated >42 >59 mL/min    Comment: (NOTE) Calculated using the CKD-EPI Creatinine Equation (2021)    Anion gap 8 5 - 15    Comment: Performed at [facility], [address], [place], [place] [address]    Blood Alcohol level:  Lab Results  Component Value Date   ETH <10 07/13/2021    Metabolic Disorder Labs: Lab Results  Component Value Date   HGBA1C 6.7 (H) 07/15/2021   MPG 146 07/15/2021   No results found for: PROLACTIN Lab Results  Component Value Date   CHOL 180 07/15/2021   TRIG 90 07/15/2021   HDL 33 (L) 07/15/2021   CHOLHDL 5.5 07/15/2021   VLDL 18 07/15/2021   LDLCALC 129 (H) 07/15/2021    Physical Findings: AIMS: NA CIWA:  CIWA-Ar Total: 9 COWS:  COWS Total Score: 5  Musculoskeletal: Strength &  Muscle Tone:  untested Gait & Station: normal, steady Patient leans: Front  Psychiatric Specialty Exam:  Presentation  General Appearance: Appropriate for Environment  Eye Contact:Poor  Speech:Clear and Coherent  Speech Volume:Normal  Handedness:Right   Mood and Affect  Mood:Depressed  Affect:Congruent   Thought Process  Thought Processes:Coherent; Linear , linear  Descriptions of Associations:Intact  Orientation:Full (Time, Place and Person)  Thought Content:Logical  - no evidence of acute psychosis on exam and denies AVH, paranoia, or delusions  History of Schizophrenia/Schizoaffective disorder:No  Duration of Psychotic Symptoms:No data recorded Hallucinations:Hallucinations: None  Ideas of Reference:None  Suicidal Thoughts:Suicidal Thoughts: No  Homicidal Thoughts:Homicidal Thoughts: No   Sensorium  Memory:Immediate Good; Recent Good; Remote  Good  Judgment:Fair  Insight:Fair   Executive Functions  Concentration:Good  Attention Span:Good  Recall:Good  Fund of Knowledge:Good  Language:Good   Psychomotor Activity  Psychomotor Activity:Psychomotor Activity: Normal   Assets  Assets:Housing; Research scientist (medical); Physical Health   Sleep  Sleep:Sleep: Fair Number of Hours of Sleep: 5    Physical Exam: Physical Exam Vitals reviewed.  HENT:     Head: Normocephalic and atraumatic.  Pulmonary:     Effort: Pulmonary effort is normal.  Neurological:     General: No focal deficit present.     Mental Status: He is alert and oriented to person, place, and time.   Review of Systems  Respiratory:  Negative for shortness of breath.   Cardiovascular:  Negative for chest pain.  Gastrointestinal:  Positive for nausea. Negative for vomiting.  Neurological:  Negative for headaches.  Blood pressure (!) 140/95, pulse (!) 101, temperature 98.4 F (36.9 C), temperature source Oral, resp. rate 18, height 5\' 6"  (1.676 m), weight 83 kg, SpO2 98 %. Body mass index is 29.54 kg/m.   Treatment Plan Summary: Daily contact with patient to assess and evaluate symptoms and progress in treatment and Medication management   Safety and Monitoring --VOLUNTARY admission to inpatient psychiatric unit for safety, stabilization and treatment -- Daily contact with patient to assess and evaluate symptoms and progress in treatment -- Patient's case to be discussed in multi-disciplinary team meeting -- Observation Level : q15 minute checks -- Vital signs:  q12 hours -- Precautions: suicide   Substance Induced depressive disorder -Encourage abstinence from prescription pill abuse -Motivational interviewing -Continue Remeron 15mg  qhs for residual depressive symptoms             -pt counseled on r/b/se of medication   Opioid use d/o on MAT Sedative/hypnotic/anxiolytic use d/o Patient likely experienced significant benzo w/d -Educated  patient regarding abrupt discontinuation of benzos and methadone -COWS scoring but not restarting methadone since he has been off medication prior to admission and is unclear about recent self-adjusted dosing - he can re-establish with methadone clinic after discharge if desired -Clonidine 0.1 mg PRN for elevated BP -Symptomatic opioid w/d treatment: PRN Robaxin, Bentyl, Imodium, Zofran, Naproxen -CIWA: 1 mg Ativan q6hr PRN for score >10 -Ativan taper 1 mg qid->tid->bid (7/26)->qd -multivitamin with thiamine - encouraged consideration of residential rehab or SAIOP and he will consider   Medical Management EKG with NSR, Qtc 469 CBC unremarkable CMP with elevated LFTs AST/ALT of 142/204, no stigmata of liver dz             -Repeat 7/25 of 88/177             -Hepatitis panel negative   -Repeat LFTs on 7/26: 91/181 -will need outpatient f/u with PCP for w/u after discharge -Confer with pharmacy regarding  Remeron dosing UDS with benzos EtOH <10 TSH: 0.68 Lipids: HDL 33, LDL 129, otherwise wnl  New type 2 DM A1c: 6.7, qualifies for Dx of DM Monitor with qAC BS checks BID  Continue PRN's: Tylenol, Maalox, Atarax, Milk of Magnesia, Trazodone  Carlyn ReichertNick Anyelin Mogle PGY-1, Psychiatry

## 2021-07-16 NOTE — Progress Notes (Signed)
   07/15/21 2356  Psych Admission Type (Psych Patients Only)  Admission Status Voluntary  Psychosocial Assessment  Patient Complaints Anxiety;Depression  Eye Contact Fair  Facial Expression Anxious  Affect Appropriate to circumstance  Speech Logical/coherent  Interaction Minimal  Motor Activity Fidgety;Restless  Appearance/Hygiene Unremarkable  Behavior Characteristics Cooperative  Mood Anxious  Thought Process  Coherency WDL  Content WDL  Delusions None reported or observed  Perception WDL  Hallucination None reported or observed  Judgment Impaired  Confusion None  Danger to Self  Current suicidal ideation? Denies  Danger to Others  Danger to Others None reported or observed

## 2021-07-16 NOTE — Progress Notes (Signed)
Recreation Therapy Notes  Animal-Assisted Activity (AAA) Program Checklist/Progress Notes Patient Eligibility Criteria Checklist & Daily Group note for Rec Tx Intervention  Date: 7.26.22 Time: 1430 Location: [address] Dayroom  AAA/T Program Assumption of Risk Form signed by Patient/ or Parent Legal Guardian  YES   Patient is free of allergies or severe asthma YES   Patient reports no fear of animals YES  Patient reports no history of cruelty to animals YES  Patient understands his/her participation is voluntary YES  Patient washes hands before animal contact  YES  Patient washes hands after animal contact  YES  Behavioral Response: Engaged  Education: Hand Washing, Appropriate Animal Interaction   Education Outcome: Acknowledges understanding/In group clarification offered/Needs additional education.   Clinical Observations/Feedback: Pt attended and participated in group.    Azizah Lisle, LRT/CTRS        Toyia Jelinek A 07/16/2021 3:13 PM 

## 2021-07-16 NOTE — Plan of Care (Signed)
Nurse discussed coping skills with patient.  

## 2021-07-16 NOTE — [facility] Group Notes (Signed)
Adult Psychoeducational Group Note  Date:  07/16/2021 Time:  4:39 AM  Group Topic/Focus:  Personal Choices and Values:   The focus of this group is to help patients assess and explore the importance of values in their lives, how their values affect their decisions, how they express their values and what opposes their expression.  Participation Level:  Active  Participation Quality:  Appropriate  Affect:  Appropriate  Cognitive:  Alert  Insight: Good  Engagement in Group:  Engaged  Modes of Intervention:  Discussion  Additional Comments  Douglas Long 07/16/2021, 4:39 AM

## 2021-07-16 NOTE — [facility] Group Notes (Signed)
[facility] Group Notes:  (Nursing/MHT/Case Management/Adjunct)  Date:  07/16/2021  Time:  8:26 PM  Type of Therapy:  Group Therapy  Participation Level:  Minimal  Participation Quality:  Attentive  Affect:  Anxious  Cognitive:  Alert  Insight:  Lacking  Engagement in Group:  Lacking  Modes of Intervention:  Discussion  Summary of Progress/Problems:  Lorita Officer 07/16/2021, 8:26 PM

## 2021-07-16 NOTE — Progress Notes (Signed)
D:  Patient denied SI and HI, contracts for safety.  Denied A/V hallucinations.  Denied pain. A:  Medications administered per MD orders.  Emotional support and encouragement given patient. R:  Safety maintained with 15 minute checks.  Patient stated he is ready for discharge.  Thought he would be discharged today.

## 2021-07-16 NOTE — Progress Notes (Signed)
Adult Psychoeducational Group Note  Date:  07/16/2021 Time:  10:04 AM  Group Topic/Focus:  Goals Group:   The focus of this group is to help patients establish daily goals to achieve during treatment and discuss how the patient can incorporate goal setting into their daily lives to aide in recovery.  Participation Level:  Active  Participation Quality:  Attentive  Affect:  Appropriate  Cognitive:  Appropriate  Insight: Appropriate  Engagement in Group:  Engaged  Modes of Intervention:  Discussion  Additional Comments:The patient expressed that he was ready for dischaege  Octavio Manns 07/16/2021, 10:04 AM

## 2021-07-17 LAB — GLUCOSE, CAPILLARY
Glucose-Capillary: 111 mg/dL — ABNORMAL HIGH (ref 70–99)
Glucose-Capillary: 104 mg/dL — ABNORMAL HIGH (ref 70–99)
Glucose-Capillary: 88 mg/dL (ref 70–99)
Glucose-Capillary: 102 mg/dL — ABNORMAL HIGH (ref 70–99)

## 2021-07-17 MED ORDER — METFORMIN HCL 500 MG PO TABS
ORAL_TABLET | ORAL | Status: AC
  Administered 2021-07-17: 500 mg
  Filled 2021-07-17: qty 1

## 2021-07-17 MED ORDER — METFORMIN HCL 500 MG PO TABS
500.0000 mg | ORAL_TABLET | Freq: Every day | ORAL | Status: DC
  Administered 2021-07-18: 500 mg via ORAL
  Filled 2021-07-17: qty 7
  Filled 2021-07-17 (×4): qty 1

## 2021-07-17 NOTE — Progress Notes (Signed)
[facility] MD Progress Note  07/17/2021 11:10 AM Douglas Long  MRN:  [id number] Subjective:   Douglas Long is a 55 YOM with opioid use disorder who presents voluntarily for depression and passive SI.  24 hr events: no documented behavioral issues, no PRN medications given for agitation. Patient received 1 mg Ativan PRN, despite CIWA scores of 3 and 1.   On interview this morning, patient's thought process and affect is unchanged from previous days. He reports asking for Ativan last night when he was anxious. He was counseled on the need to abstain from future benzodiazepine use given his dependence on Xanax. He reports that he feels like he is coming down off methadone despite the fact that he last took the medication several weeks ago. He was informed about his new diagnosis of T2DM. He was emotional and reports feeling overwhelmed and scared. Education and counseling given. He desires to start MF after being counseled on r/b/se. He denies SI, HI, AVH.   Total Time Spent in Direct Patient Care: I personally spent 30 minutes on the unit in direct patient care. The direct patient care time included face-to-face time with the patient, reviewing the patient's chart, communicating with other professionals, and coordinating care. Greater than 50% of this time was spent in counseling or coordinating care with the patient regarding goals of hospitalization, psycho-education, and discharge planning needs.   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 No pertinent past medical history.  Past Surgical History:  Procedure Laterality Date   BACK SURGERY     Family History: History reviewed. No pertinent family history. Family Psychiatric   History: pt denies relevant Hx Social History:   15 py smoking Hx  Additional Social History:   Marital status: Long term relationship Long term relationship, how long?: since 2005 What types of issues is patient dealing with in the relationship?: Reports stress in relationship with girlfriend. she doesn't understand his mental health and withdrawl struggles. 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   Sleep: Poor, however, patient is found in bed during daytime  Appetite:  Fair  Current Medications: Current Facility-Administered Medications  Medication Dose Route Frequency Provider Last Rate Last Admin   metFORMIN (GLUCOPHAGE) 500 MG tablet            acetaminophen (TYLENOL) tablet 650 mg  650 mg Oral Q6H PRN Laveda Abbe, NP   650 mg at 07/16/21 1437   cloNIDine (CATAPRES) tablet 0.1 mg  0.1 mg Oral Q8H PRN Comer Locket, MD       dicyclomine (BENTYL) tablet 20 mg  20 mg Oral Q6H PRN Laveda Abbe, NP   20 mg at 07/14/21 2113   LORazepam (ATIVAN) tablet 1 mg  1 mg Oral Q6H PRN Carlyn Reichert, MD   1 mg at 07/16/21 2048   metFORMIN (GLUCOPHAGE) tablet 500 mg  500 mg Oral Q breakfast Carlyn Reichert, MD       methocarbamol (ROBAXIN) tablet 500 mg  500 mg Oral Q8H PRN Laveda Abbe, NP  500 mg at 07/16/21 0735   mirtazapine (REMERON) tablet 15 mg  15 mg Oral QHS Comer Locket, MD   15 mg at 07/16/21 2048   multivitamin with minerals tablet 1 tablet  1 tablet Oral Daily Laveda Abbe, NP   1 tablet at 07/17/21 0803   naproxen (NAPROSYN) tablet 500 mg  500 mg Oral BID PRN Laveda Abbe, NP   500 mg at 07/16/21 2048   nicotine (NICODERM CQ - dosed in mg/24 hours) patch 14  mg  14 mg Transdermal Daily Laveda Abbe, NP   14 mg at 07/17/21 0800   thiamine tablet 100 mg  100 mg Oral Daily Laveda Abbe, NP   100 mg at 07/17/21 7591    Lab Results:  Results for orders placed or performed during the hospital encounter of 07/13/21 (from the past 48 hour(s))  Comprehensive metabolic panel     Status: Abnormal   Collection Time: 07/16/21  6:09 AM  Result Value Ref Range   Sodium 140 135 - 145 mmol/L   Potassium 3.7 3.5 - 5.1 mmol/L   Chloride 107 98 - 111 mmol/L   CO2 25 22 - 32 mmol/L   Glucose, Bld 106 (H) 70 - 99 mg/dL    Comment: Glucose reference range applies only to samples taken after fasting for at least 8 hours.   BUN 10 6 - 20 mg/dL   Creatinine, Ser 6.38 0.61 - 1.24 mg/dL   Calcium 9.4 8.9 - 46.6 mg/dL   Total Protein 7.4 6.5 - 8.1 g/dL   Albumin 4.4 3.5 - 5.0 g/dL   AST 91 (H) 15 - 41 U/L   ALT 181 (H) 0 - 44 U/L   Alkaline Phosphatase 69 38 - 126 U/L   Total Bilirubin 0.7 0.3 - 1.2 mg/dL   GFR, Estimated >59 >93 mL/min    Comment: (NOTE) Calculated using the CKD-EPI Creatinine Equation (2021)    Anion gap 8 5 - 15    Comment: Performed at [facility], [address], [place], [place] [address]  Glucose, capillary     Status: None   Collection Time: 07/16/21  8:19 PM  Result Value Ref Range   Glucose-Capillary 89 70 - 99 mg/dL    Comment: Glucose reference range applies only to samples taken after fasting for at least 8 hours.  Glucose, capillary     Status: Abnormal   Collection Time: 07/17/21  6:07 AM  Result Value Ref Range   Glucose-Capillary 111 (H) 70 - 99 mg/dL    Comment: Glucose reference range applies only to samples taken after fasting for at least 8 hours.   Comment 1 Notify RN     Blood Alcohol level:  Lab Results  Component Value Date   ETH <10 07/13/2021    Metabolic Disorder Labs: Lab Results  Component Value Date   HGBA1C 6.7 (H) 07/15/2021   MPG 146 07/15/2021   No results  found for: PROLACTIN Lab Results  Component Value Date   CHOL 180 07/15/2021   TRIG 90 07/15/2021   HDL 33 (L) 07/15/2021   CHOLHDL 5.5 07/15/2021   VLDL 18 07/15/2021   LDLCALC 129 (H) 07/15/2021    Physical Findings: AIMS: NA CIWA:  CIWA-Ar Total: 1 COWS:  COWS Total Score: 3  Musculoskeletal: Strength & Muscle Tone:  untested Gait & Station: normal, steady Patient leans: Front  Psychiatric Specialty Exam:  Presentation  General Appearance: Appropriate for Environment  Eye Contact:Good  Speech:Clear and Coherent  Speech Volume:Normal  Handedness:Right   Mood and Affect  Mood:Depressed  Affect:Congruent   Thought Process  Thought Processes:Coherent; Linear , linear  Descriptions of Associations:Intact  Orientation:Full (Time, Place and Person)  Thought Content:Logical  - no evidence of acute psychosis on exam and denies AVH, paranoia, or delusions  History of Schizophrenia/Schizoaffective disorder:No  Duration of Psychotic Symptoms:No data recorded Hallucinations:Hallucinations: None  Ideas of Reference:None  Suicidal Thoughts:Suicidal Thoughts: No  Homicidal Thoughts:Homicidal Thoughts: No   Sensorium  Memory:Immediate Good; Recent Good; Remote Good  Judgment:Poor  Insight:Poor   Executive Functions  Concentration:Good  Attention Span:Good  Recall:Good  Fund of Knowledge:Good  Language:Good   Psychomotor Activity  Psychomotor Activity:Psychomotor Activity: Normal   Assets  Assets:Housing; Social Support   Sleep  Sleep:Sleep: Poor Number of Hours of Sleep: 5.75    Physical Exam: Physical Exam Vitals reviewed.  HENT:     Head: Normocephalic and atraumatic.  Pulmonary:     Effort: Pulmonary effort is normal.  Neurological:     General: No focal deficit present.     Mental Status: He is alert and oriented to person, place, and time.   Review of Systems  Respiratory:  Negative for shortness of breath.    Cardiovascular:  Negative for chest pain.  Gastrointestinal:  Negative for nausea and vomiting.  Neurological:  Negative for headaches.  Blood pressure (!) 130/91, pulse (!) 126, temperature 98.5 F (36.9 C), temperature source Oral, resp. rate 18, height 5\' 6"  (1.676 m), weight 83 kg, SpO2 97 %. Body mass index is 29.54 kg/m.   Treatment Plan Summary: Daily contact with patient to assess and evaluate symptoms and progress in treatment and Medication management   Safety and Monitoring --VOLUNTARY admission to inpatient psychiatric unit for safety, stabilization and treatment -- Daily contact with patient to assess and evaluate symptoms and progress in treatment -- Patient's case to be discussed in multi-disciplinary team meeting -- Observation Level : q15 minute checks -- Vital signs:  q12 hours -- Precautions: suicide   Substance Induced depressive disorder -Encourage abstinence from prescription pill abuse -Motivational interviewing -Continue Remeron 15mg  qhs for residual depressive symptoms             -pt counseled on r/b/se of medication   Opioid use d/o on MAT Sedative/hypnotic/anxiolytic use d/o Patient likely experienced significant benzo w/d -Educated patient regarding abrupt discontinuation of benzos and methadone -COWS scoring but not restarting methadone since he has been off medication prior to admission and is unclear about recent self-adjusted dosing - he can re-establish with methadone clinic after discharge if desired -Clonidine 0.1 mg PRN for elevated BP -Symptomatic opioid w/d treatment: PRN Robaxin, Bentyl, Imodium, Zofran, Naproxen -CIWA: 1 mg Ativan q6hr PRN for score >10 -Ativan taper 1 mg qid->tid->bid (7/26)->qd 7/28 -multivitamin with thiamine - encouraged consideration of residential rehab or SAIOP and he will consider   Medical Management EKG with NSR, Qtc 469 CBC unremarkable CMP with elevated LFTs AST/ALT of 142/204, no stigmata of liver dz              -Repeat 7/25 of 88/177             -Hepatitis panel negative   -Repeat LFTs on 7/26: 91/181 -will need outpatient f/u with PCP for w/u after discharge -No need for change in med dosing per pharmacy UDS with benzos EtOH <10 TSH: 0.68 Lipids: HDL 33, LDL 129, otherwise wnl  New type 2 DM A1c: 6.7, qualifies for Dx of  DM Monitor with daily CBG checks -Start MF 500 mg daily with breakfast -Counseled on diet and exercise  Continue PRN's: Tylenol, Maalox, Atarax, Milk of Magnesia, Trazodone  Carlyn Reichert PGY-1, Psychiatry

## 2021-07-17 NOTE — Progress Notes (Signed)
Recreation Therapy Notes  Date: 7.27.22 Time: 0930 Location: [address] Dayroom  Group Topic: Stress Management   Goal Area(s) Addresses:  Patient will actively participate in stress management techniques presented during session.  Patient will successfully identify benefit of practicing stress management post d/c.   Behavioral Response: Appropriate  Intervention: Guided exercise with ambient sound and script  Activity :Guided Imagery  LRT provided education, instruction, and demonstration on practice of visualization via guided imagery. Patients was asked to participate in the technique introduced during session. Patients were given suggestions of ways to access scripts post d/c and encouraged to explore Youtube and other apps available on smartphones, tablets, and computers.   Education:  Stress Management, Discharge Planning.   Education Outcome: Acknowledges education  Clinical Observations/Feedback: Patient actively engaged in technique introduced and expressed no concerns.     Onica Davidovich, LRT/CTRS         Javarion Douty A 07/17/2021 12:06 PM 

## 2021-07-17 NOTE — Progress Notes (Signed)
Pt has been isolative in the room crying stating how he misses his mom. Pt stated he feel like nothing can help him, he has been looking for help and has not been able to get one. Pt medicated with Ativan for anxiety, naproxen for pain.

## 2021-07-17 NOTE — [facility] Group Notes (Signed)
LCSW Group Therapy Note  07/17/2021   Type of Therapy and Topic:  Group Therapy - Healthy vs Unhealthy Coping Skills  Participation Level:  Active   Description of Group The focus of this group was to determine what unhealthy coping techniques typically are used by group members and what healthy coping techniques would be helpful in coping with various problems. Patients were guided in becoming aware of the differences between healthy and unhealthy coping techniques. Patients were asked to identify 2-3 healthy coping skills they would like to learn to use more effectively, and many mentioned meditation, breathing, and relaxation. These were explained, samples demonstrated, and resources shared for how to learn more at discharge. At group closing, additional ideas of healthy coping skills were shared in a fun exercise.  Therapeutic Goals Patients learned that coping is what human beings do all day long to deal with various situations in their lives Patients defined and discussed healthy vs unhealthy coping techniques Patients identified their preferred coping techniques and identified whether these were healthy or unhealthy Patients determined 2-3 healthy coping skills they would like to become more familiar with and use more often, and practiced a few medications Patients provided support and ideas to each other   Summary of Patient Progress:  During introductions, patient shared his name and that current coping skills he uses are music and contacting a help line/therapist.    Therapeutic Modalities Cognitive Behavioral Therapy Motivational Interviewing  Fredirick Lathe, LCSWA Clinicial Social Worker [employer]

## 2021-07-17 NOTE — [facility] Suicide Risk Assessment (Signed)
[facility] INPATIENT:  Family/Significant Other Suicide Prevention Education  Suicide Prevention Education:  Contact Attempts: [place] Code, [phone], (name of family member/significant other) has been identified by the patient as the family member/significant other with whom the patient will be residing, and identified as the person(s) who will aid the patient in the event of a mental health crisis.  With written consent from the patient, two attempts were made to provide suicide prevention education, prior to and/or following the patient's discharge.  We were unsuccessful in providing suicide prevention education.  A suicide education pamphlet was given to the patient to share with family/significant other.  Date and time of first attempt:07/17/21    /     2:55pm CSW left a HIPAA compliant message.  Date and time of second attempt: CSW will make another attempt at a later time.   Felizardo Hoffmann 07/17/2021, 2:56 PM

## 2021-07-17 NOTE — Progress Notes (Signed)
The patient attended the evening N.A.meeting and was appropriate.  

## 2021-07-18 LAB — GLUCOSE, CAPILLARY: Glucose-Capillary: 77 mg/dL (ref 70–99)

## 2021-07-18 MED ORDER — METFORMIN HCL 500 MG PO TABS: 500.0000 mg | ORAL_TABLET | Freq: Every day | ORAL | 0 refills | Status: DC

## 2021-07-18 MED ORDER — MIRTAZAPINE 15 MG PO TABS: 15.0000 mg | ORAL_TABLET | Freq: Every day | ORAL | 0 refills | Status: DC

## 2021-07-18 MED ORDER — MIRTAZAPINE 15 MG PO TABS: 15.0000 mg | ORAL_TABLET | Freq: Every day | ORAL | 0 refills | Status: AC

## 2021-07-18 MED ORDER — METFORMIN HCL 500 MG PO TABS: 500.0000 mg | ORAL_TABLET | Freq: Every day | ORAL | 0 refills | Status: AC

## 2021-07-18 NOTE — Discharge Summary (Signed)
Physician Discharge Summary Note  Patient:  Douglas Long is an 45 y.o., male MRN:  [id number] DOB:  06/05/1976 Patient phone:  [phone] (home)  Patient address:   [address] [place] [address],  Total Time Spent in Direct Patient Care on Day of Discharge: I personally spent 30 minutes on the unit in direct patient care. The direct patient care time included face-to-face time with the patient, reviewing the patient's chart, communicating with other professionals, and coordinating care. Greater than 50% of this time was spent in counseling or coordinating care with the patient regarding goals of hospitalization, psycho-education, and discharge planning needs.   Date of Admission:  07/13/2021 Date of Discharge: 07/18/2021  Reason for Admission:  Per H and P: Douglas Long is a 29 YOM with opioid use disorder who presents voluntarily for depression and passive SI.   The patient voluntarily came to [facility] in the evening on 7/22, desiring detox from methadone and Xanax, but also complaining of passive SI and depression.   On interview, patient reports feeling "tired and beat down". He has poor eye contact and fidgets frequently, playing with a cup throughout the interview. He reports that he follows with [facility], where he has been on 175 mg of methadone daily for the past 5 years.  He then describes a longstanding Hx of back pain, for which he came to a neurosurgeon who required him to wean down his methadone before a potential operation. The patient decided to wean himself down on his own. He reports that he took half of his prescribed dose each day and saved the rest so that he could sell it on the streets. He reports that this went well for about a week and then he stopped taking his methadone entirely on July 15th. Then on July 22nd, he reports an episode where the "the world came crashing in on me" and "I flipped out". He cannot elaborate further, but appears to be  describing some kind of mental anguish and not physical withdrawal. The patient has previously reported that he was taking 4-5 mg of Xanax daily (no scripts in PDMP).   Over his stay in the ED, he received Ativan x2 and upon arrival to the unit on 7/23 received another 2 mg of Ativan for withdrawal symptoms (presumably from Xanax), with reports that he was "seeing stars". He was quickly started on an Ativan taper. On exam today, the patient does not appear to be in active opiate withdrawal. He is not irritable with plenty of energy and willingness to engage in the interview.   He describes a mild depressive symptomatology, noting only anhedonia as a problem recently. He denies symptoms of BPAD, AVH, and denies previous traumatic experiences. He is fully alert and oriented and able to answer common sense questions. When asked his goals for this hospitalization he reports that he wants to be able to go back to work, restart methadone, and is indifferent to whether he continues taking Xanax or not. In terms of disposition, he desires to go home. He denies SI and says he has no Hx of SA.   Principal Problem: Substance or medication-induced depressive disorder [facility]) Discharge Diagnoses: Principal Problem:   Substance or medication-induced depressive disorder (HCC) Active Problems:   Benzodiazepine abuse (HCC)   Opiate dependence (HCC)   Past Psychiatric History: as above  Past Medical History: History reviewed. No pertinent past medical history.  Past Surgical History:  Procedure Laterality Date   BACK SURGERY  Family History: History reviewed. No pertinent family history. Family Psychiatric  History: denies any Hx of mental illness or suicide Social History:  Marital status: Long term relationship Long term relationship, how long?: since 2005 What types of issues is patient dealing with in the relationship?: Reports stress in relationship with girlfriend. she doesn't understand his mental  health and withdrawl struggles. 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  Hospital Course:  The patient was started on Remeron for depression and titrated to 15 mg qhs with no side effects. His Ativan taper was completed uneventfully, with a final CIWA score of 1 (based on this examiner's assessment). It did not appear that the patient was in opiate w/d at any point. He was persistently tachycardic but both of his EKGs showed sinus rhythms. Of note, the patient was found to have elevated LFTs on this admission (AST/ALT of 88/177 even on recheck). Hepatitis panel was negative. Methadone is potential contributor. He will need follow up with his OP PCP. He was also found to have an A1C of 6.7 and was started on metformin 500 mg daily. He was counseled on dietary and exercise interventions for T2DM.   During the course of his hospitalization, the 15-minute checks were adequate to ensure patient's safety. The patient did not display any dangerous, violent or suicidal behavior on the unit.  He interacted with patients & staff appropriately, participated appropriately in the group sessions/therapies. His medications were addressed & adjusted to meet his needs. He was recommended for outpatient follow-up care & medication management upon discharge to assure continuity of care & mood stability.  At the time of discharge patient is not reporting any acute suicidal/homicidal ideations. He feels more confident about his self-care & in managing his mental health. He currently denies any new issues or concerns. Education and supportive counseling provided throughout his hospital stay & upon discharge.   Today upon his discharge evaluation with  the attending psychiatrist, the patient shares he is doing well and feels ready for discharge. He denies any other specific concerns. He is sleeping well. His appetite is good. He denies other physical complaints. He denies AH/VH, delusional thoughts or paranoia. He does not appear to be responding to any internal stimuli. He feels that his medications have been helpful & is in agreement to continue his current treatment regimen as recommended. He was able to engage in safety planning including plan to return to [facility] or contact emergency services if he feels unable to maintain his own safety or the safety of others. Pt had no further questions, comments, or concerns. He left [facility] with all personal belongings in no apparent distress. Transportation per [employer].  Physical Findings: AIMS: pt not on antipsychotic medication CIWA:  1 COWS:  COWS Total Score: 12  Musculoskeletal: Strength & Muscle Tone: untested Gait & Station: normal Patient leans: Front  Psychiatric Specialty Exam:  Presentation  General Appearance: Appropriate for Environment  Eye Contact:Fair  Speech:Clear and Coherent  Speech Volume:Normal  Handedness:Right   Mood and Affect  Mood:Depressed  Affect:Congruent   Thought Process  Thought Processes:Coherent; Linear  Descriptions of Associations:Intact  Orientation:Full (Time, Place and Person)  Thought Content:Logical  History of Schizophrenia/Schizoaffective disorder:No  Duration of Psychotic Symptoms:No data recorded Hallucinations:Hallucinations: None  Ideas of Reference:None  Suicidal Thoughts:Suicidal Thoughts: No  Homicidal Thoughts:Homicidal Thoughts: No   Sensorium  Memory:Immediate Good; Recent Poor; Remote Good  Judgment:Poor  Insight:Poor   Executive Functions  Concentration:Fair  Attention Span:Fair  Recall:Fair  Fund of Knowledge:Fair  Language:Fair   Psychomotor Activity  Psychomotor Activity:Psychomotor  Activity: Normal   Assets  Assets:Housing; Social Support   Sleep  Sleep:Sleep: Fair Number of Hours of Sleep: 6    Physical Exam: Physical Exam Vitals reviewed.  HENT:     Head: Normocephalic and atraumatic.  Pulmonary:     Effort: Pulmonary effort is normal.  Neurological:     General: No focal deficit present.     Mental Status: He is alert and oriented to person, place, and time.   Review of Systems  Respiratory:  Negative for shortness of breath.   Cardiovascular:  Negative for chest pain.  Gastrointestinal:  Negative for nausea and vomiting.  Neurological:  Negative for headaches.  Blood pressure 127/87, pulse (!) 118, temperature 98.6 F (37 C), temperature source Oral, resp. rate 18, height 5\' 6"  (1.676 m), weight 83 kg, SpO2 100 %. Body mass index is 29.54 kg/m.   Social History   Tobacco Use  Smoking Status Every Day   Packs/day: 0.50   Years: 15.00   Pack years: 7.50   Types: Cigarettes  Smokeless Tobacco Never   Tobacco Cessation:  A prescription for an FDA-approved tobacco cessation medication provided at discharge   Blood Alcohol level:  Lab Results  Component Value Date   ETH <10 07/13/2021    Metabolic Disorder Labs:  Lab Results  Component Value Date   HGBA1C 6.7 (H) 07/15/2021   MPG 146 07/15/2021   No results found for: PROLACTIN Lab Results  Component Value Date   CHOL 180 07/15/2021   TRIG 90 07/15/2021   HDL 33 (L) 07/15/2021   CHOLHDL 5.5 07/15/2021   VLDL 18 07/15/2021   LDLCALC 129 (H) 07/15/2021    See Psychiatric Specialty Exam and Suicide Risk Assessment completed by Attending Physician prior to discharge.  Discharge destination:  Home  Is patient on multiple antipsychotic therapies at discharge:  No   Has Patient had three or more failed trials of antipsychotic monotherapy by history:  No  Recommended Plan for Multiple Antipsychotic Therapies: NA  Discharge Instructions     Diet - low sodium heart healthy    Complete by: As directed    Increase activity slowly   Complete by: As directed       Allergies as of 07/18/2021   No Known Allergies      Medication List     STOP taking these medications    METHADONE HCL PO       TAKE these medications      Indication  metFORMIN 500 MG tablet Commonly known as: GLUCOPHAGE Take 1 tablet (500 mg total) by mouth daily with breakfast. Start taking on: July 19, 2021  Indication: Type 2 Diabetes   mirtazapine 15 MG tablet Commonly known as: REMERON Take 1 tablet (15 mg total) by mouth at bedtime.  Indication: Major Depressive Disorder        Follow-up Information     [facility], [facility]. Go on 07/23/2021.   Why: You have a hospital follow up appointment for therapy and medication management services on 07/23/21 at 8:30 am.   This appointment will be held in person. Contact information: [address] [place] [phone]  [facility]. Call.   Why: Please call your provider to schedule an appointment/s for methadone treatment services, between 5:00 am and 10:00 am Monday through Friday. Contact information: [address] [place], [place] [address]  Michaelene SongOpens 5AM Tue  Phone: [phone]                Follow-up recommendations:   Activity as tolerated. Diet as recommended by PCP. Keep all scheduled follow-up appointments as recommended.  Patient is instructed to take all prescribed medications as recommended. Report any side effects or adverse reactions to your outpatient psychiatrist. Patient is instructed to abstain from alcohol and illegal drugs while on prescription medications. In the event of worsening symptoms, patient is instructed to call the crisis hotline, 911, or go to the nearest emergency department for evaluation and treatment.  Prescriptions given at discharge. Patient agreeable to plan. Given opportunity to ask questions. Appears to feel comfortable with  discharge denies any current suicidal or homicidal thought.  Patient is also instructed prior to discharge to: Take all medications as prescribed by mental healthcare provider. Report any adverse effects and or reactions from the medicines to outpatient provider promptly. Patient has been instructed & cautioned: To not engage in alcohol and or illegal drug use while on prescription medicines. In the event of worsening symptoms,  patient is instructed to call the crisis hotline, 911 and or go to the nearest ED for appropriate evaluation and treatment of symptoms. To follow-up with primary care provider for other medical issues, concerns and or health care needs  The patient was evaluated each day by a clinical provider to ascertain response to treatment. Improvement was noted by the patient's report of decreasing symptoms, improved sleep and appetite, affect, medication tolerance, behavior, and participation in unit programming.  Patient was asked each day to complete a self inventory noting mood, mental status, pain, new symptoms, anxiety and concerns.  Patient responded well to medication and being in a therapeutic and supportive environment. Positive and appropriate behavior was noted and the patient was motivated for recovery. The patient worked closely with the treatment team and case manager to develop a discharge plan with appropriate goals. Coping skills, problem solving as well as relaxation therapies were also part of the unit programming.  By the day of discharge patient was in much improved condition than upon admission.  Symptoms were reported as significantly decreased or resolved completely. The patient denied SI/HI and voiced no AVH. The patient was motivated to continue taking medication with a goal of continued improvement in mental health.   Patient was discharged home with a plan to follow up as noted below.   Signed: Carlyn ReichertNick Averie Hornbaker PGY-1, Psychiatry

## 2021-07-18 NOTE — Progress Notes (Signed)
  [facility] Adult Case Management Discharge Plan :  Will you be returning to the same living situation after discharge:  Yes,  with parents or girlfriend At discharge, do you have transportation home?: Yes,  via parents Do you have the ability to pay for your medications: Yes,  support from parents  Release of information consent forms completed and in the chart;  Patient's signature needed at discharge.  Patient to Follow up at:  Follow-up Information     [facility], [facility]. Go on 07/23/2021.   Why: You have a hospital follow up appointment for therapy and medication management services on 07/23/21 at 8:30 am.   This appointment will be held in person. Contact information: [address] [place] [address] [phone]         [facility]. Call.   Why: Please call your provider to schedule an appointment/s for methadone treatment services, between 5:00 am and 10:00 am Monday through Friday. Contact information: [address] [place], [place] [address]  Michaelene Song Tue  Phone: [phone]                Next level of care provider has access to [facility] Link:no  Safety Planning and Suicide Prevention discussed: Yes,  with mother     Has patient been referred to the Quitline?: Patient refused referral  Patient has been referred for addiction treatment: N/A  Felizardo Hoffmann, Theresia Majors 07/18/2021, 10:44 AM

## 2021-07-18 NOTE — [facility] Suicide Risk Assessment (Signed)
[facility] INPATIENT:  Family/Significant Other Suicide Prevention Education  Suicide Prevention Education:  Education Completed;  mother Azaiah Licciardi, [phone],  (name of family member/significant other) has been identified by the patient as the family member/significant other with whom the patient will be residing, and identified as the person(s) who will aid the patient in the event of a mental health crisis (suicidal ideations/suicide attempt).  With written consent from the patient, the family member/significant other has been provided the following suicide prevention education, prior to the and/or following the discharge of the patient.  The suicide prevention education provided includes the following: Suicide risk factors Suicide prevention and interventions National Suicide Hotline telephone number [facility] assessment telephone number [facility] Emergency Assistance 911 [facility] and/or Residential Mobile Crisis Unit telephone number  Request made of family/significant other to: Remove weapons (e.g., guns, rifles, knives), all items previously/currently identified as safety concern.   Remove drugs/medications (over-the-counter, prescriptions, illicit drugs), all items previously/currently identified as a safety concern.  The family member/significant other verbalizes understanding of the suicide prevention education information provided.  The family member/significant other agrees to remove the items of safety concern listed above.  "I thought he had turned crazy. He called one day and asked if he can stay with [place] so I don't know what is going on with him and his girlfriend.".  -Weapons in home have been secured -No safety concerns  Felizardo Hoffmann 07/18/2021, 10:41 AM

## 2021-07-18 NOTE — Progress Notes (Signed)
Discharge Note:  Patient discharged home with family member. Patient denied SI and HI.  Denied A/V hallucinations. Suicide prevention information given and discussed with patient who stated he understood and had no questions. Patient stated he received all his belongings, clothing, toiletries, misc items, etc.  Patient stated he appreciated all assistance received from [facility] staff.  All required discharge information given to patient. 

## 2021-07-18 NOTE — [facility] Group Notes (Signed)
Patient did not attend morning goal setting group.

## 2021-07-18 NOTE — [facility] Suicide Risk Assessment (Signed)
[facility] [place] Discharge Suicide Risk Assessment   Principal Problem: Substance or medication-induced depressive disorder (HCC) Discharge Diagnoses: Principal Problem:   Substance or medication-induced depressive disorder (HCC) Active Problems:   Benzodiazepine abuse (HCC)   Opiate dependence (HCC)   Total Time spent with patient: 15 minutes  Musculoskeletal: Strength & Muscle Tone: within normal limits Gait & Station: normal Patient leans: N/A  Psychiatric Specialty Exam  Presentation  General Appearance: Appropriate for Environment  Eye Contact:Good  Speech:Clear and Coherent  Speech Volume:Normal  Handedness:Right   Mood and Affect  Mood:Depressed  Duration of Depression Symptoms: Less than two weeks  Affect:Congruent   Thought Process  Thought Processes:Coherent; Linear  Descriptions of Associations:Intact  Orientation:Full (Time, Place and Person)  Thought Content:Logical  History of Schizophrenia/Schizoaffective disorder:No  Duration of Psychotic Symptoms:No data recorded Hallucinations:Hallucinations: None  Ideas of Reference:None  Suicidal Thoughts:Suicidal Thoughts: No  Homicidal Thoughts:Homicidal Thoughts: No   Sensorium  Memory:Immediate Good; Recent Good; Remote Good  Judgment:Poor  Insight:Poor   Executive Functions  Concentration:Good  Attention Span:Good  Recall:Good  Fund of Knowledge:Good  Language:Good   Psychomotor Activity  Psychomotor Activity:Psychomotor Activity: Normal   Assets  Assets:Housing; Social Support   Sleep  Sleep:Sleep: Poor Number of Hours of Sleep: 5.75   Physical Exam: Physical Exam Vitals and nursing note reviewed.  Constitutional:      Appearance: Normal appearance.  HENT:     Head: Normocephalic and atraumatic.  Pulmonary:     Effort: Pulmonary effort is normal.  Neurological:     General: No focal deficit present.     Mental Status: He is alert and oriented to person, place, and  time.   Review of Systems  All other systems reviewed and are negative. Blood pressure 127/87, pulse (!) 118, temperature 98.6 F (37 C), temperature source Oral, resp. rate 18, height 5\' 6"  (1.676 m), weight 83 kg, SpO2 100 %. Body mass index is 29.54 kg/m.  Mental Status Per Nursing Assessment::   On Admission:  NA  Demographic Factors:  Caucasian and Unemployed  Loss Factors: Financial problems/change in socioeconomic status  Historical Factors: Impulsivity  Risk Reduction Factors:   Living with another person, especially a relative and Positive social support  Continued Clinical Symptoms:  Depression:   Comorbid alcohol abuse/dependence Impulsivity Alcohol/Substance Abuse/Dependencies  Cognitive Features That Contribute To Risk:  None    Suicide Risk:  Minimal: No identifiable suicidal ideation.  Patients presenting with no risk factors but with morbid ruminations; may be classified as minimal risk based on the severity of the depressive symptoms   Follow-up Information     [facility], [facility]. Go on 07/23/2021.   Why: You have a hospital follow up appointment for therapy and medication management services on 07/23/21 at 8:30 am.   This appointment will be held in person. Contact information: [address] [place] [phone]         [facility]. Call.   Why: Please call your provider to schedule an appointment/s for methadone treatment services, between 5:00 am and 10:00 am Monday through Friday. Contact information: [address] [place], [place]  [address] Tue  Phone: [phone]                Plan Of Care/Follow-up recommendations:  Activity:  ad lib  [phone], MD 07/18/2021, 9:55 AM

## 2021-07-18 NOTE — Progress Notes (Signed)
   07/17/21 2114  Psych Admission Type (Psych Patients Only)  Admission Status Voluntary  Psychosocial Assessment  Patient Complaints Anxiety;Depression  Eye Contact Fair  Facial Expression Anxious;Sad;Sullen  Affect Appropriate to circumstance  Speech Logical/coherent  Interaction Minimal;Guarded  Motor Activity Fidgety;Restless  Appearance/Hygiene Unremarkable  Behavior Characteristics Anxious  Mood Depressed;Anxious  Thought Process  Coherency WDL  Content WDL  Delusions None reported or observed  Perception WDL  Hallucination None reported or observed  Judgment Impaired  Confusion None  Danger to Self  Current suicidal ideation? Denies  Danger to Others  Danger to Others None reported or observed

## 2021-11-07 ENCOUNTER — Ambulatory Visit (HOSPITAL_COMMUNITY)
Admission: EM | Admit: 2021-11-07 | Discharge: 2021-11-07 | Disposition: A | Discharge status: Home / Self Care | Payer: No Payment, Other | Attending: Nurse Practitioner | Admitting: Nurse Practitioner

## 2021-11-07 DIAGNOSIS — F419 Anxiety disorder, unspecified: Secondary | ICD-10-CM | POA: Insufficient documentation

## 2021-11-07 DIAGNOSIS — Z79899 Other long term (current) drug therapy: Secondary | ICD-10-CM | POA: Insufficient documentation

## 2021-11-07 DIAGNOSIS — F1994 Other psychoactive substance use, unspecified with psychoactive substance-induced mood disorder: Secondary | ICD-10-CM | POA: Insufficient documentation

## 2021-11-07 NOTE — BH Assessment (Signed)
Douglas Long is a 45 year old male presenting to [facility] voluntarily. Patient was brought in by his parents. Per Nira Conn, NP, shortly after arriving, the patient was sitting in a chair and slid down to the floor.  Staff that witnessed the event report that the patient did not fall and did not hit his head.  Patient found lying on the floor in the waiting room. Due to patient initial presentation, patient was seen by provider and [facility] to ensure medical safety. Patient denies SI, HI and psychosis. Patient initially denied drug usage with provider and admitted to using a drug, however patient was not forthcoming with information. Patient states that he wants to leave.

## 2021-11-07 NOTE — Progress Notes (Signed)
Pt verbalized feelings of intense anxiety to the provider.  He kicked off his shoes and socks saying "I can't have these on my feet.  My feet are burning."  He reported that he could not stay very long because he has to be at [facility] at 0500 in order to get his methadone.  Pt was sitting in the lobby after being registered and then escorted to the triage room for privacy.  He was brought to this facility by his parents.  They thought he was acting strangely and so they brought him here to be evaluated.  Pt reports that he does not like to be around a lot of people and he did not enjoy his stay at [facility] back in July, mostly due to that fact.  He just kept repeating that he needs to talk to a therapist to help him deal with his anxiety.  He said that he no longer suffers from road rage and no longer uses foul language.  He has made great efforts to control/handle his anxiety but sometimes it is more than he knows how to handle.  He recently lost his job and this has increased his anxiety also.  He is in a supportive relationship with his significant other.  He is the only child of his elderly parents and does not want to cause them undue stress because they are "really good people".  He also added that his mother has fought cancer twice and won.  Pt was provided additional printed resources on the time and location to seek outpatient behavioral health.

## 2021-11-07 NOTE — Discharge Instructions (Addendum)
Therapy Walk-in Hours  Monday-Wednesday: 8 AM until slots are full  Friday: 1 PM to 5 PM  For Monday to Wednesday, it is recommended that patients arrive between 7:30 AM and 7:45 AM because patients will be seen in the order of arrival.  For Friday, we ask that patients arrive between 12 PM to 12:30 PM.  Go to the second floor on arrival and check in.  **Availability is limited; therefore, patients may not be seen on the same day.**  Medication management walk-ins:  Monday to Friday: 8 AM to 11 AM.  It is recommended that patients arrive by 7:30 AM to 7:45 AM because patients will be seen in the order of arrival.  Go to the second floor on arrival and check in.  **Availability is limited; therefore, patients may not be seen on the same day.**   __________________________________  Discharge recommendations:  Patient is to take medications as prescribed. Please see information for follow-up appointment with psychiatry and therapy. Please follow up with your primary care provider for all medical related needs.   Therapy: We recommend that patient participate in individual therapy to address mental health concerns.  Safety:  The patient should abstain from use of illicit substances/drugs and abuse of any medications. If symptoms worsen or do not continue to improve or if the patient becomes actively suicidal or homicidal then it is recommended that the patient return to the closest hospital emergency department, the [facility], or call 911 for further evaluation and treatment. National Suicide Prevention Lifeline 1-800-SUICIDE or 386-309-1367.  About 988 988 offers 24/7 access to trained crisis counselors who can help people experiencing mental health-related distress. People can call or text 988 or chat 988lifeline.org for themselves or if they are worried about a loved one who may need crisis support.

## 2021-11-07 NOTE — ED Provider Notes (Signed)
Behavioral Health Urgent Care Medical Screening Exam  Patient Name: Douglas Long MRN: [id number] Date of Evaluation: 11/07/21 Chief Complaint:   Diagnosis:  Final diagnoses:  Substance induced mood disorder (HCC)    History of Present illness: Douglas Long is a 45 y.o. male who presents to [facility] voluntarily with his parents.  The patient's parents report that the patient's girlfriend took him to their house tonight because she said the patient was acting odd.  They report that after arriving at their house he walked off.  They state other than that, they are not sure what is going on.  Shortly after arriving, the patient was sitting in a chair and slid down to the floor.  Staff that witnessed the event report that the patient did not fall and did not hit his head.  Patient found lying on the floor in the waiting room.  Patient not responding verbally.  Patient tracks staff and provider with his eyes.  Patient grimaces upon painful stimuli.  Respirations even and unlabored.  O2 sat 100%.  No seizure activity noted.  No loss of bowel or bladder control.  Blood pressure within normal limits.  No tachycardia.  After 2 to 3 minutes, patient began responding verbally.  Assisted patient to triage during.  Patient ambulatory.  Patient refused transfer to the emergency room for medical clearance.  Signed AMA form.  Patient initially reports that he has not used any illicit substances.  Denies using benzodiazepines.  Denies overdosing on any medications.  Patient reports that he takes methadone 140 mg daily at [facility].  He states he typically goes at 5 AM.  Patient later grins and reports that he did use a substance but does not want to say what it was.  Provider listed multiple illicit substances and controlled medications, patient denies use of all of these.  Patient denies suicidal ideations.  He denies homicidal ideations.  He denies auditory and visual hallucinations.  He reports that he had hallucinations  several years ago when he used acid.  He does not appear to be responding to internal stimuli.  No delusions elicited during this assessment.  Patient reports excessive worry.  He states that nothing works for his anxiety and no one will give him Xanax anymore.  Psychiatric Specialty Exam  Presentation  General Appearance:Fairly Groomed  Eye Contact:Good  Speech:Clear and Coherent; Normal Rate  Speech Volume:Normal  Handedness:Right   Mood and Affect  Mood:Anxious  Affect:Congruent   Thought Process  Thought Processes:Coherent; Linear  Descriptions of Associations:Intact  Orientation:Full (Time, Place and Person)  Thought Content:Logical  Diagnosis of Schizophrenia or Schizoaffective disorder in past: No   Hallucinations:None  Ideas of Reference:None  Suicidal Thoughts:No  Homicidal Thoughts:No   Sensorium  Memory:Immediate Good; Recent Fair; Remote Fair  Judgment:Intact  Insight:Present   Executive Functions  Concentration:Fair  Attention Span:Fair  Recall:Fair  Fund of Knowledge:Fair  Language:Good   Psychomotor Activity  Psychomotor Activity:Restlessness   Assets  Assets:Desire for Improvement; Housing; Physical Health   Sleep  Sleep:Fair  Number of hours: 6   No data recorded  Physical Exam: Physical Exam Vitals reviewed.  Constitutional:      General: He is not in acute distress.    Appearance: He is not ill-appearing, toxic-appearing or diaphoretic.  HENT:     Head: Normocephalic and atraumatic.     Right Ear: External ear normal.     Left Ear: External ear normal.  Eyes:     Conjunctiva/sclera: Conjunctivae normal.  Pupils: Pupils are equal, round, and reactive to light.  Cardiovascular:     Rate and Rhythm: Normal rate.  Pulmonary:     Effort: Pulmonary effort is normal. No respiratory distress.  Musculoskeletal:        General: Normal range of motion.     Cervical back: Normal range of motion.  Skin:     General: Skin is warm and dry.  Neurological:     General: No focal deficit present.     Mental Status: He is alert and oriented to person, place, and time.  Psychiatric:        Attention and Perception: He does not perceive auditory or visual hallucinations.        Mood and Affect: Mood is anxious and depressed.        Speech: Speech normal.        Behavior: Behavior is cooperative.        Thought Content: Thought content does not include homicidal or suicidal ideation. Thought content does not include suicidal plan.   Review of Systems  Constitutional:  Negative for chills, diaphoresis, fever, malaise/fatigue and weight loss.  HENT:  Negative for congestion.   Eyes: Negative.  Negative for blurred vision.  Respiratory:  Negative for cough and shortness of breath.   Cardiovascular:  Negative for chest pain and palpitations.  Gastrointestinal:  Negative for diarrhea, nausea and vomiting.  Musculoskeletal: Negative.   Neurological:  Negative for dizziness, tingling, tremors, focal weakness, seizures and weakness.  Psychiatric/Behavioral:  Positive for depression. Negative for hallucinations, memory loss and suicidal ideas. The patient is nervous/anxious and has insomnia.   All other systems reviewed and are negative.  Blood pressure (!) 103/57, pulse 94, resp. rate 18, SpO2 100 %. There is no height or weight on file to calculate BMI.  Today's Vitals   11/07/21 0352 11/07/21 0353 11/07/21 0400 11/07/21 0430  BP: (!) 103/57  (!) 148/90 (!) 146/82  Pulse: 94  92 94  Resp:  18 16 18   Temp:    98.4 F (36.9 C)  SpO2: 100%  98% 100%   There is no height or weight on file to calculate BMI.   Musculoskeletal: Strength & Muscle Tone: within normal limits Gait & Station: normal Patient leans: N/A   [facility] MSE Discharge Disposition for Follow up and Recommendations: Based on my evaluation, the patient could benefit from medical clearance in the emergency department.  However, the  patient refused transfer to the ED.  Patient signed AMA form.  Patient is not an imminent risk to self or others at this time.  Patient requests discharge so that he can go to [facility] for his daily methadone dose.  Patient reports that he takes methadone 140 mg daily.  Patient states that he wants to see a therapist "that does not cost an arm and a leg."  Discussed services provided at [facility].  Discussed substance abuse intensive outpatient program.  Patient has a history of benzodiazepine abuse.  However, at this time it is unclear if he is continuing to use benzodiazepines.  Patient drank approximately 240 ml water and 240 ml of orange juice prior to discharge.   Nursing staff remained with patient during his time at [facility].  On discharge, patient's vital signs are stable.  He is alert and oriented x4.  Respirations are even and unlabored.  He is ambulating without difficulty.  Patient continues to deny suicidal ideations.  Denies homicidal ideations.    [facility], NP 11/07/2021, 4:46  AM

## 2021-11-09 ENCOUNTER — Emergency Department (HOSPITAL_COMMUNITY)
Admission: EM | Admit: 2021-11-09 | Discharge: 2021-11-10 | Disposition: A | Discharge status: Home / Self Care | Payer: No Typology Code available for payment source | Attending: Emergency Medicine | Admitting: Emergency Medicine

## 2021-11-09 ENCOUNTER — Emergency Department (HOSPITAL_COMMUNITY)
Admission: EM | Admit: 2021-11-09 | Discharge: 2021-11-09 | Disposition: A | Discharge status: Home / Self Care | Payer: No Typology Code available for payment source | Attending: Emergency Medicine | Admitting: Emergency Medicine

## 2021-11-09 ENCOUNTER — Other Ambulatory Visit: Payer: Self-pay

## 2021-11-09 ENCOUNTER — Encounter (HOSPITAL_COMMUNITY): Payer: Self-pay | Admitting: Emergency Medicine

## 2021-11-09 DIAGNOSIS — Z20822 Contact with and (suspected) exposure to covid-19: Secondary | ICD-10-CM | POA: Insufficient documentation

## 2021-11-09 DIAGNOSIS — Z5321 Procedure and treatment not carried out due to patient leaving prior to being seen by health care provider: Secondary | ICD-10-CM | POA: Insufficient documentation

## 2021-11-09 DIAGNOSIS — F1120 Opioid dependence, uncomplicated: Secondary | ICD-10-CM | POA: Insufficient documentation

## 2021-11-09 DIAGNOSIS — F1994 Other psychoactive substance use, unspecified with psychoactive substance-induced mood disorder: Secondary | ICD-10-CM | POA: Diagnosis present

## 2021-11-09 DIAGNOSIS — R45851 Suicidal ideations: Secondary | ICD-10-CM | POA: Insufficient documentation

## 2021-11-09 DIAGNOSIS — F1110 Opioid abuse, uncomplicated: Secondary | ICD-10-CM | POA: Diagnosis present

## 2021-11-09 DIAGNOSIS — F1914 Other psychoactive substance abuse with psychoactive substance-induced mood disorder: Secondary | ICD-10-CM | POA: Insufficient documentation

## 2021-11-09 DIAGNOSIS — Y900 Blood alcohol level of less than 20 mg/100 ml: Secondary | ICD-10-CM | POA: Insufficient documentation

## 2021-11-09 DIAGNOSIS — F1910 Other psychoactive substance abuse, uncomplicated: Secondary | ICD-10-CM | POA: Clinically undetermined

## 2021-11-09 DIAGNOSIS — R198 Other specified symptoms and signs involving the digestive system and abdomen: Secondary | ICD-10-CM | POA: Insufficient documentation

## 2021-11-09 DIAGNOSIS — F17210 Nicotine dependence, cigarettes, uncomplicated: Secondary | ICD-10-CM | POA: Insufficient documentation

## 2021-11-09 LAB — CBC
HCT: 36.5 % — ABNORMAL LOW (ref 39.0–52.0)
Hemoglobin: 12.9 g/dL — ABNORMAL LOW (ref 13.0–17.0)
MCH: 32 pg (ref 26.0–34.0)
MCHC: 35.3 g/dL (ref 30.0–36.0)
MCV: 90.6 fL (ref 80.0–100.0)
Platelets: 362 10*3/uL (ref 150–400)
RBC: 4.03 MIL/uL — ABNORMAL LOW (ref 4.22–5.81)
RDW: 13.2 % (ref 11.5–15.5)
WBC: 8.2 10*3/uL (ref 4.0–10.5)
nRBC: 0 % (ref 0.0–0.2)

## 2021-11-09 LAB — RAPID URINE DRUG SCREEN, HOSP PERFORMED
Amphetamines: NOT DETECTED
Barbiturates: NOT DETECTED
Benzodiazepines: NOT DETECTED
Cocaine: NOT DETECTED
Opiates: NOT DETECTED
Tetrahydrocannabinol: NOT DETECTED

## 2021-11-09 LAB — COMPREHENSIVE METABOLIC PANEL
ALT: 98 U/L — ABNORMAL HIGH (ref 0–44)
AST: 64 U/L — ABNORMAL HIGH (ref 15–41)
Albumin: 4.5 g/dL (ref 3.5–5.0)
Alkaline Phosphatase: 77 U/L (ref 38–126)
Anion gap: 9 (ref 5–15)
BUN: 11 mg/dL (ref 6–20)
CO2: 25 mmol/L (ref 22–32)
Calcium: 9 mg/dL (ref 8.9–10.3)
Chloride: 101 mmol/L (ref 98–111)
Creatinine, Ser: 0.91 mg/dL (ref 0.61–1.24)
GFR, Estimated: >60 mL/min (ref >60)
Glucose, Bld: 136 mg/dL — ABNORMAL HIGH (ref 70–99)
Potassium: 2.9 mmol/L — ABNORMAL LOW (ref 3.5–5.1)
Sodium: 135 mmol/L (ref 135–145)
Total Bilirubin: 0.6 mg/dL (ref 0.3–1.2)
Total Protein: 8.2 g/dL — ABNORMAL HIGH (ref 6.5–8.1)

## 2021-11-09 LAB — RESP PANEL BY RT-PCR (FLU A&B, COVID) ARPGX2
Influenza A by PCR: NEGATIVE
Influenza B by PCR: NEGATIVE
SARS Coronavirus 2 by RT PCR: NEGATIVE

## 2021-11-09 LAB — ETHANOL: Alcohol, Ethyl (B): <10 mg/dL (ref <10)

## 2021-11-09 MED ORDER — POTASSIUM CHLORIDE CRYS ER 20 MEQ PO TBCR
40.0000 meq | EXTENDED_RELEASE_TABLET | Freq: Once | ORAL | Status: AC
  Administered 2021-11-09: 40 meq via ORAL
  Filled 2021-11-09: qty 2

## 2021-11-09 MED ORDER — HYDROXYZINE HCL 25 MG PO TABS
50.0000 mg | ORAL_TABLET | Freq: Once | ORAL | Status: AC
  Administered 2021-11-09: 50 mg via ORAL
  Filled 2021-11-09: qty 2

## 2021-11-09 NOTE — ED Notes (Signed)
Patient changed into burgundy scrubs, belongings (shorts, sweatshirt, shoes) bagged and placed in belongings cabinet behind triage nurses station.

## 2021-11-09 NOTE — ED Triage Notes (Signed)
Pt from home via EMS. Pt states he feels like he has a snake in his stomach and chest. Pt seen at [facility] on 11/17 for same. Denies SI/HI.

## 2021-11-09 NOTE — ED Provider Notes (Signed)
[facility]-EMERGENCY DEPT Provider Note   CSN: [id number] Arrival date & time: 11/09/21  0759     History Chief Complaint  Patient presents with   Dizziness    Douglas Long is a 45 y.o. male.  Patient with history of chronic methadone use, history of polysubstance abuse --presents to the emergency department for evaluation today.  Patient states that he is burning up on the inside and feels like his skin is literally falling off of his body.  He is concerned that there is something seriously wrong "on the inside".  Patient was seen at [facility] on 11/17 and it was recommended that he be medically evaluated in the emergency department but refused.  Patient states that he was brought to the ED today by his parents after going and getting methadone.  He denies a plan of suicide but states that he wants to "go easy" if there is something wrong with him.  He acknowledges having hallucinations but is unable to tell me details about these.  He states that he has not been sleeping.      No past medical history on file.  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  Social History   Tobacco Use   Smoking status: Every Day    Packs/day: 0.50    Years: 15.00    Pack years: 7.50    Types: Cigarettes   Smokeless tobacco: Never  Vaping Use   Vaping Use: Never used  Substance Use Topics   Alcohol use: Never   Drug use: Yes    Types: Benzodiazepines    Comment: Methadone    Home Medications Prior to Admission medications   Medication Sig Start Date End Date Taking? Authorizing Provider  metFORMIN (GLUCOPHAGE) 500 MG tablet Take 1 tablet (500 mg total) by mouth daily with breakfast. 07/19/21   Lauro Franklin, MD  mirtazapine (REMERON) 15 MG tablet Take 1 tablet (15 mg total) by mouth at bedtime. 07/18/21   Lauro Franklin, MD    Allergies    Patient has no known allergies.  Review of Systems   Review of Systems  Constitutional:  Negative for fever.  HENT:  Negative for sore throat.   Eyes:  Negative for redness.  Respiratory:  Negative for cough.   Cardiovascular:  Negative for chest pain.  Gastrointestinal:  Positive for abdominal pain. Negative for diarrhea, nausea and vomiting.  Genitourinary:  Negative for dysuria.  Musculoskeletal:  Negative for myalgias.  Skin:  Negative for rash.  Neurological:  Positive for dizziness. Negative for headaches.  Psychiatric/Behavioral:  Positive for behavioral problems, hallucinations, sleep disturbance and suicidal ideas. The patient is nervous/anxious.    Physical Exam Updated Vital Signs BP (!) 143/93 (BP Location: Right Arm)   Pulse 84   Temp 98.6 F (37 C) (Oral)   Resp 16   SpO2 91%   Physical Exam Vitals and nursing note reviewed.  Constitutional:      General: He is not in acute distress.    Appearance: He is well-developed.  HENT:     Head: Normocephalic and atraumatic.  Eyes:     General:        Right eye: No discharge.  Left eye: No discharge.     Conjunctiva/sclera: Conjunctivae normal.  Cardiovascular:     Rate and Rhythm: Normal rate and regular rhythm.     Heart sounds: Normal heart sounds.  Pulmonary:     Effort: Pulmonary effort is normal.     Breath sounds: Normal breath sounds.  Abdominal:     Palpations: Abdomen is soft.     Tenderness: There is no abdominal tenderness.  Musculoskeletal:     Cervical back: Normal range of motion and neck supple.  Skin:    General: Skin is warm and dry.  Neurological:     Mental Status: He is alert.  Psychiatric:        Attention and Perception: Attention normal.        Mood and Affect: Mood is depressed.        Speech: Speech normal.         Thought Content: Thought content is delusional. Thought content includes suicidal ideation. Thought content does not include suicidal plan.    ED Results / Procedures / Treatments   Labs (all labs ordered are listed, but only abnormal results are displayed) Labs Reviewed  CBC - Abnormal; Notable for the following components:      Result Value   RBC 4.03 (*)    Hemoglobin 12.9 (*)    HCT 36.5 (*)    All other components within normal limits  COMPREHENSIVE METABOLIC PANEL  ETHANOL  RAPID URINE DRUG SCREEN, HOSP PERFORMED    EKG None  Radiology No results found.  Procedures Procedures   Medications Ordered in ED Medications - No data to display  ED Course  I have reviewed the triage vital signs and the nursing notes.  Pertinent labs & imaging results that were available during my care of the patient were reviewed by me and considered in my medical decision making (see chart for details).  Patient seen and examined. Work-up initiated.  Pending medical clearance labs.  Agree with TTS consult.  Vital signs reviewed and are as follows: BP (!) 143/93 (BP Location: Right Arm)   Pulse 84   Temp 98.6 F (37 C) (Oral)   Resp 16   SpO2 91%   2:26 PM Awaiting UDS but patient is medically cleared. Awaiting TTS consult.     MDM Rules/Calculators/A&P                           Pending psych eval.   Final Clinical Impression(s) / ED Diagnoses Final diagnoses:  Suicidal ideation    Rx / DC Orders ED Discharge Orders     None        Renne Crigler, PA-C 11/09/21 1429    Horton, Clabe Seal, DO 11/09/21 1536

## 2021-11-09 NOTE — ED Notes (Signed)
Registration brought patient stickers to this nurse, stating "patient is leaving AMA because he needs to go to the meth clinic"

## 2021-11-09 NOTE — ED Notes (Signed)
Pt resting in bed, NAD

## 2021-11-09 NOTE — ED Notes (Signed)
Pt given water, blanket per request

## 2021-11-09 NOTE — ED Notes (Signed)
Pt given pillow.

## 2021-11-09 NOTE — ED Notes (Signed)
Pt given blanket.

## 2021-11-09 NOTE — ED Notes (Signed)
Pt also states that there are ticks in his blood. He denies using any illicit substances tonight. States that he has had a hotdog, diet coke and nothing illegal.

## 2021-11-09 NOTE — ED Notes (Signed)
Patient wanded by security and cleared at this time.

## 2021-11-09 NOTE — ED Triage Notes (Addendum)
Patient reports he is hurting on the inside. Says he is falling apart in front of his face. States his memory is gone. Unable to rate pain. Says he is dizzy x 1 year, and wants to be healthy. Patient was triaged earlier and LWBS, please see interaction.   Patient says he wants blood work to see how far along he is. Endorses SI without plan.

## 2021-11-09 NOTE — ED Notes (Signed)
Pt NAD in hall C. A/ox4, states he has been very confused lately, not feeling "all there". Pt states he was told by PMD to start tapering down methadone dose as this could be the cause. Pt currently a/o but states last night he had a really bad episode of not being there. Pt denies visual or auditory hallucinations, SI/HI, has been med compliant.

## 2021-11-09 NOTE — ED Notes (Signed)
Pt given water 

## 2021-11-09 NOTE — ED Notes (Signed)
Pt given meal tray, states all other needs being met

## 2021-11-09 NOTE — ED Notes (Signed)
Pt begins taking off clothing stating he is burning up and "maybe I shoudnt be here, im just infecting everyone". Pt appears very anxious and fidgety. Provider made aware for med request
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
## 2021-11-10 DIAGNOSIS — F1110 Opioid abuse, uncomplicated: Secondary | ICD-10-CM | POA: Diagnosis present

## 2021-11-10 DIAGNOSIS — F1994 Other psychoactive substance use, unspecified with psychoactive substance-induced mood disorder: Secondary | ICD-10-CM

## 2021-11-10 DIAGNOSIS — F1910 Other psychoactive substance abuse, uncomplicated: Secondary | ICD-10-CM | POA: Clinically undetermined

## 2021-11-10 MED ORDER — MIRTAZAPINE 7.5 MG PO TABS: 15.0000 mg | ORAL_TABLET | Freq: Every day | ORAL | Status: DC

## 2021-11-10 MED ORDER — IBUPROFEN 200 MG PO TABS
400.0000 mg | ORAL_TABLET | Freq: Once | ORAL | Status: AC
  Administered 2021-11-10: 400 mg via ORAL
  Filled 2021-11-10: qty 2

## 2021-11-10 MED ORDER — METFORMIN HCL 500 MG PO TABS: 500.0000 mg | ORAL_TABLET | Freq: Every day | ORAL | Status: DC

## 2021-11-10 NOTE — Consult Note (Signed)
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 male patient admitted with suicidal ideations, altered mental status.  Patient presents alert. Oriented to person, day of week, month, and year.  "I was feeling really sluggish and slow. I'm feeling okay now. I'm ready to go". States he's been off his schedule of methadone. Denies any other illicit substance use. He denies any active suicidal or homicidal ideations, auditory or visual hallucinations. Patient has been continuously monitored in the emergency department where there have been no signs of psychosis or self harm. He does not appear to be responding to any external/internal stimuli at the time of this time. Provider discussed SAIOP, he expressed interest.   Past Psychiatric History: polysubstance abuse, methadone abuse, substance or medication induced depressive disorder, opiate dependence  Risk to Self:  pt denies Risk to Others:  pt denies Prior Inpatient Therapy:  yes Prior Outpatient Therapy:  yes  Past Medical History: No past medical history on file.  Past Surgical History:  Procedure Laterality Date   BACK SURGERY     Family History: No family history on file. Family Psychiatric  History: not noted Social History:  Social History   Substance and Sexual Activity  Alcohol Use Never     Social History   Substance and Sexual Activity  Drug Use Yes   Types: Benzodiazepines   Comment: Methadone    Social  History   Socioeconomic History   Marital status: Single    Spouse name: Not on file   Number of children: Not on file   Years of education: Not on file   Highest education level: Not on file  Occupational History   Not on file  Tobacco Use   Smoking status: Every Day    Packs/day: 0.50    Years: 15.00    Pack years: 7.50    Types: Cigarettes   Smokeless tobacco: Never  Vaping Use   Vaping Use: Never used  Substance and Sexual Activity   Alcohol use: Never   Drug use: Yes    Types: Benzodiazepines    Comment: Methadone   Sexual activity: Yes  Other Topics Concern   Not on file  Social History Narrative   Not on file   Social Determinants of Health   Financial Resource Strain: Not on file  Food Insecurity: Not on file  Transportation Needs: Not on file  Physical Activity: Not on file  Stress: Not on file  Social Connections: Not on file   Additional Social History:    Allergies:  No Known Allergies  Labs:  Results for orders placed or performed during the hospital encounter of 11/09/21 (from the past 48 hour(s))  Comprehensive metabolic panel     Status: Abnormal   Collection Time: 11/09/21  8:34 AM  Result Value Ref [place]   Sodium 135 135 - 145 mmol/L   Potassium 2.9 (L) 3.5 - 5.1 mmol/L   Chloride 101 98 - 111 mmol/L   CO2 25 22 - 32 mmol/L   Glucose, Bld 136 (H)  70 - 99 mg/dL    Comment: Glucose reference range applies only to samples taken after fasting for at least 8 hours.   BUN 11 6 - 20 mg/dL   Creatinine, Ser 7.74 0.61 - 1.24 mg/dL   Calcium 9.0 8.9 - 12.8 mg/dL   Total Protein 8.2 (H) 6.5 - 8.1 g/dL   Albumin 4.5 3.5 - 5.0 g/dL   AST 64 (H) 15 - 41 U/L   ALT 98 (H) 0 - 44 U/L   Alkaline Phosphatase 77 38 - 126 U/L   Total Bilirubin 0.6 0.3 - 1.2 mg/dL   GFR, Estimated >78 >67 mL/min    Comment: (NOTE) Calculated using the CKD-EPI Creatinine Equation (2021)    Anion gap 9 5 - 15    Comment: Performed at [facility], [address], [place], [place] [address]  Ethanol     Status: None   Collection Time: 11/09/21  8:34 AM  Result Value Ref [place]   Alcohol, Ethyl (B) <10 <10 mg/dL    Comment: (NOTE) Lowest detectable limit for serum alcohol is 10 mg/dL.  For medical purposes only. Performed at [facility], [address] [place], [place] [address]   cbc     Status: Abnormal   Collection Time: 11/09/21  8:34 AM  Result Value Ref [place]   WBC 8.2 4.0 - 10.5 K/uL   RBC 4.03 (L) 4.22 - 5.81 MIL/uL   Hemoglobin 12.9 (L) 13.0 - 17.0 g/dL   HCT 28.3 (L) 66.2 - 94.7 %   MCV 90.6 80.0 - 100.0 fL   MCH 32.0 26.0 - 34.0 pg   MCHC 35.3 30.0 - 36.0 g/dL   RDW 65.4 65.0 - 35.4 %   Platelets 362 150 - 400 K/uL   nRBC 0.0 0.0 - 0.2 %    Comment: Performed at [facility], [address] [place], [place] [address]  Resp Panel by RT-PCR (Flu A&B, Covid) Nasopharyngeal Swab     Status: None   Collection Time: 11/09/21  2:28 PM   Specimen: Nasopharyngeal Swab; Nasopharyngeal(NP) swabs in vial transport medium  Result Value Ref [place]   SARS Coronavirus 2 by RT PCR NEGATIVE NEGATIVE    Comment: (NOTE) SARS-CoV-2 target nucleic acids are NOT DETECTED.  The SARS-CoV-2 RNA is generally detectable in upper respiratory specimens during the acute phase of infection. The lowest concentration of SARS-CoV-2 viral copies this assay can detect is 138 copies/mL. A negative result does not preclude SARS-Cov-2 infection and should not be used as the sole basis for treatment or other patient management decisions. A negative result may occur with  improper specimen collection/handling, submission of specimen other than nasopharyngeal swab, presence of viral mutation(s) within the areas targeted by this assay, and inadequate number of viral copies(<138 copies/mL). A negative result must be combined with clinical observations, patient history, and epidemiological information. The expected  result is Negative.  Fact Sheet for Patients:  BloggerCourse.com  Fact Sheet for Healthcare Providers:  SeriousBroker.it  This test is no t yet approved or cleared by the [place] FDA and  has been authorized for detection and/or diagnosis of SARS-CoV-2 by FDA under an Emergency Use Authorization (EUA). This EUA will remain  in effect (meaning this test can be used) for the duration of the COVID-19 declaration under Section 564(b)(1) of the Act, 21 U.S.C.section 360bbb-3(b)(1), unless the authorization is terminated  or revoked sooner.       Influenza A by PCR  NEGATIVE NEGATIVE   Influenza B by PCR NEGATIVE NEGATIVE    Comment: (NOTE) The Xpert Xpress SARS-CoV-2/FLU/RSV plus assay is intended as an aid in the diagnosis of influenza from Nasopharyngeal swab specimens and should not be used as a sole basis for treatment. Nasal washings and aspirates are unacceptable for Xpert Xpress SARS-CoV-2/FLU/RSV testing.  Fact Sheet for Patients: EntrepreneurPulse.com.au  Fact Sheet for Healthcare Providers: IncredibleEmployment.be  This test is not yet approved or cleared by the [place] FDA and has been authorized for detection and/or diagnosis of SARS-CoV-2 by FDA under an Emergency Use Authorization (EUA). This EUA will remain in effect (meaning this test can be used) for the duration of the COVID-19 declaration under Section 564(b)(1) of the Act, 21 U.S.C. section 360bbb-3(b)(1), unless the authorization is terminated or revoked.  Performed at [facility], [place] [address], [place], [place] [address]   Rapid urine drug screen (hospital performed)     Status: None   Collection Time: 11/09/21  4:15 PM  Result Value Ref [place]   Opiates NONE DETECTED NONE DETECTED   Cocaine NONE DETECTED NONE DETECTED   Benzodiazepines NONE DETECTED NONE DETECTED   Amphetamines NONE  DETECTED NONE DETECTED   Tetrahydrocannabinol NONE DETECTED NONE DETECTED   Barbiturates NONE DETECTED NONE DETECTED    Comment: (NOTE) DRUG SCREEN FOR MEDICAL PURPOSES ONLY.  IF CONFIRMATION IS NEEDED FOR ANY PURPOSE, NOTIFY LAB WITHIN 5 DAYS.  LOWEST DETECTABLE LIMITS FOR URINE DRUG SCREEN Drug Class                     Cutoff (ng/mL) Amphetamine and metabolites    1000 Barbiturate and metabolites    200 Benzodiazepine                 [id number] Tricyclics and metabolites     300 Opiates and metabolites        300 Cocaine and metabolites        300 THC                            50 Performed at [facility], [place] [address] [place], [place] [address]     Medications:  Current Facility-Administered Medications  Medication Dose Route Frequency Provider Last Rate Last Admin   [START ON 11/11/2021] metFORMIN (GLUCOPHAGE) tablet 500 mg  500 mg Oral Q breakfast Sherwood Gambler, MD       mirtazapine (REMERON) tablet 15 mg  15 mg Oral QHS Sherwood Gambler, MD       Current Outpatient Medications  Medication Sig Dispense Refill   methadone (DOLOPHINE) 10 MG/ML solution Take 140 mg by mouth in the morning.     metFORMIN (GLUCOPHAGE) 500 MG tablet Take 1 tablet (500 mg total) by mouth daily with breakfast. (Patient not taking: Reported on 11/09/2021) 30 tablet 0   mirtazapine (REMERON) 15 MG tablet Take 1 tablet (15 mg total) by mouth at bedtime. (Patient not taking: Reported on 11/09/2021) 30 tablet 0   Musculoskeletal: Strength & Muscle Tone: within normal limits Gait & Station: normal Patient leans: N/A  Psychiatric Specialty Exam:  Presentation  General Appearance: Appropriate for Environment  Eye Contact:Good  Speech:Clear and Coherent  Speech Volume:Normal  Handedness:Right   Mood and Affect  Mood:Euthymic  Affect:Congruent   Thought Process  Thought Processes:Coherent; Linear  Descriptions of Associations:Intact  Orientation:Full (Time, Place  and Person)  Thought Content:Logical  History of Schizophrenia/Schizoaffective disorder:No  Duration of Psychotic Symptoms:No data recorded Hallucinations:Hallucinations: None Ideas of Reference:None  Suicidal Thoughts:Suicidal Thoughts: No Homicidal Thoughts:Homicidal Thoughts: No  Sensorium  Memory:Immediate Fair; Recent Fair; Remote Fair  Judgment:Intact  Insight:Present   Executive Functions  Concentration:Fair  Attention Span:Fair  [place]   Psychomotor Activity  Psychomotor Activity:Psychomotor Activity: Restlessness  Assets  Assets:Desire for Improvement; Housing; Physical Health   Sleep  Sleep:Sleep: Fair   Physical Exam: Physical Exam Vitals and nursing note reviewed.  HENT:     Head: Normocephalic.     Nose: Nose normal.     Mouth/Throat:     Mouth: Mucous membranes are moist.     Pharynx: Oropharynx is clear.  Eyes:     Pupils: Pupils are equal, round, and reactive to light.  Cardiovascular:     Rate and Rhythm: Normal rate.     Pulses: Normal pulses.  Pulmonary:     Effort: Pulmonary effort is normal.  Musculoskeletal:        General: Normal range of motion.     Cervical back: Normal range of motion.  Skin:    General: Skin is warm and dry.  Neurological:     Mental Status: He is alert and oriented to person, place, and time. Mental status is at baseline.  Psychiatric:        Attention and Perception: Attention and perception normal. He does not perceive auditory or visual hallucinations.        Mood and Affect: Mood and affect normal.        Speech: Speech normal.        Behavior: Behavior normal. Behavior is cooperative.        Thought Content: Thought content normal. Thought content is not paranoid or delusional. Thought content does not include homicidal or suicidal ideation. Thought content does not include homicidal or suicidal plan.        Cognition and Memory: Cognition and memory  normal.        Judgment: Judgment normal.   Review of Systems  Psychiatric/Behavioral:  Positive for substance abuse. Negative for hallucinations and suicidal ideas.   All other systems reviewed and are negative. Blood pressure (!) 128/93, pulse 93, temperature 98.1 F (36.7 C), temperature source Oral, resp. rate 17, SpO2 96 %. There is no height or weight on file to calculate BMI.  Treatment Plan Summary: Plan Discharge patient home with outpatient resources for substance abuse intensive outpatient programs, [facility] resources.    Disposition: No evidence of imminent risk to self or others at present.   Patient does not meet criteria for psychiatric inpatient admission. Supportive therapy provided about ongoing stressors. Refer to IOP. Discussed crisis plan, support from social network, calling 911, coming to the Emergency Department, and calling Suicide Hotline.  This service was provided via telemedicine using a 2-way, interactive audio and video technology.  Names of all persons participating in this telemedicine service and their role in this encounter. Name: Oneida Alar Role: PMHNP  Name: Hampton Abbot Role: Attending MD  Name: Rodman Key Role: patient  Name:  Role:     Inda Merlin, NP 11/10/2021 1:01 PM

## 2021-11-10 NOTE — Discharge Instructions (Signed)
Substance Abuse Resources   [facility] - Admissions are currently completed Monday through Friday at 8am; both appointments and walk-ins are accepted.  Any individual that is a [facility] resident may present for a substance abuse screening and assessment for admission.  A person may be referred by numerous sources or self-refer.   Potential clients will be screened for medical necessity and appropriateness for the program.  Clients must meet criteria for high-intensity residential treatment services.  If clinically appropriate, a client will continue with the comprehensive clinical assessment and intake process, as well as enrollment in the [facility].  Address: [address] [place], [place] [address] Admin Hours: Mon-Fri 8AM to [facility] Center Hours: 24/7 Phone: [phone] Fax: [phone]  [facility] Facility Based Crisis:  These are 3 locations for services: Please call before arrival:    [facility] Recovery Facility Based Crisis [facility])  Address: [address] [place], [place] [address] Phone: [phone]  [facility] Recovery Facility Based Crisis [facility]) Address: [address] [place], [place] [address] Phone#: [phone]  [facility] Recovery Facility Based Crisis [facility]) Address: [address] [place], [place] [address] Phone#: [phone]   [facility] (offers outpatient therapy and intensive outpatient substance abuse therapy).  [address], [place], [place] [address] Phone: [phone]  [facility]:   Phone: [phone]  [facility] Intensive Outpatient Program [facility]) means structured individual and group addiction activities and services that are provided at an outpatient program designed to assist adult and adolescent consumers to begin recovery and learn skills for recovery maintenance. The [facility] SAIOP program is offered at  least 3 hours a day, 3 days a week. SAIOP services shall include a structured program consisting of, but not limited to, the following services: Individual counseling and support; Group counseling and support; Family counseling, training or support; Biochemical assays to identify recent drug use (e.g., urine drug screens); Strategies for relapse prevention to include community and social support systems in treatment; Life skills; Crisis contingency planning; Disease Management; and Treatment support activities that have been adapted or specifically designed for persons with physical disabilities, or persons with co-occurring disorders of mental illness and substance abuse/dependence or mental retardation/developmental disability and substance abuse/dependence.  Phone: [phone]   [facility])  Address: [address] [place], [place], [place] [address] Phone: [phone]   [facility] Address: [address], [place], [place] [address] Phone: [phone]  - a combination of group and individual sessions to meet the participants needs. This allows participants to engage in treatment and remain involved in their home and work life. - Transitional housing places program participants in a supportive living environment while they complete a treatment program and work to secure independent housing. - The Substance Abuse Intensive Outpatient Treatment Program at [employer] consists of structured group sessions and individual sessions that are designed to teach participants early recovery and relapse prevention skills. -[facility] works with the [employer] to provide a housing and treatment program for homeless veterans.   Residential [employer].   Address: [address] [place], [place] [address] Phone#: [phone]   : Referrals to RTSA facilities can be made by [employer] and [facility].   Referrals are also accepted from physicians, private providers, hospital emergency rooms, family members, or any person who has knowledge of someone in the need of our services.  [facility] will also offer the following outpatient services: (Monday through Friday 8am-5pm)   Partial Hospitalization Program (PHP) Substance Abuse Intensive Outpatient Program (SA-IOP) Group Therapy Medication Management Peer Living Room We also provide (24/7):  Assessments: Our mental health clinician and providers will conduct a focused mental health evaluation, assessing for immediate safety concerns and further mental health needs. Referral: Our team will provide resources and help connect to community based mental health treatment, when indicated, including psychotherapy, psychiatry, and other specialized behavioral health or substance use disorder services (for those not already in treatment). Transitional Care: Our team providers in person bridging and/or telephonic follow-up during the patient's transition to outpatient services.   The [facility] 24-Hour Call Center: [phone] Behavioral Health Crisis Line: [phone] [facility]-will provide timely access to mental health services for children and adolescents (4-17) and adults presenting in a mental health crisis. The program is designed for those who need urgent Behavioral Health or Substance Use treatment and are not experiencing a medical crisis that would typically require an emergency room visit.    [address] [place], [place] [address] Phone: [phone] [email or web address]   [facility] will also offer the following outpatient services: (Monday through Friday 8am-5pm)   Partial Hospitalization Program (PHP) Substance Abuse Intensive Outpatient Program (SA-IOP) Group Therapy Medication Management Peer Living Room   We also provide (24/7):    Assessments: Our  mental health clinician and providers will conduct a focused mental health evaluation, assessing for immediate safety concerns and further mental health needs.   Referral: Our team will provide resources and help connect to community based mental health treatment, when indicated, including psychotherapy, psychiatry, and other specialized behavioral health or substance use disorder services (for those not already in treatment).   Transitional Care: Our team providers in person bridging and/or telphonic follow-up during the patient's transition to outpatient services.

## 2021-11-10 NOTE — Progress Notes (Signed)
CSW provided the following resources for the patient to utilize:   Substance Abuse Resources   [facility] - Admissions are currently completed Monday through Friday at 8am; both appointments and walk-ins are accepted.  Any individual that is a [facility] resident may present for a substance abuse screening and assessment for admission.  A person may be referred by numerous sources or self-refer.   Potential clients will be screened for medical necessity and appropriateness for the program.  Clients must meet criteria for high-intensity residential treatment services.  If clinically appropriate, a client will continue with the comprehensive clinical assessment and intake process, as well as enrollment in the [facility].  Address: [address] [place], [place] [address] Admin Hours: Mon-Fri 8AM to [facility] Hours: 24/7 Phone: [phone] Fax: [phone]  [facility] Based Crisis:  These are 3 locations for services: Please call before arrival:    [facility] Recovery [facility] Based Crisis [facility])  Address: [address] [place], [place] [address] Phone: [phone]  [facility] Recovery Facility Based Crisis [facility]) Address: [address] [place], [place] [address] Phone#: [phone]  [facility] Facility Based Crisis [facility]) Address: [address] [place], [place] [address] Phone#: [phone]   [facility]): (offers outpatient therapy and intensive outpatient substance abuse therapy).  [address], [place], [place] [address] Phone: [phone]  [facility]:   Phone: [phone]  [facility] Intensive Outpatient Program [facility]) means structured individual and group addiction activities and services that are provided at an outpatient program designed to assist adult and adolescent consumers to begin recovery and learn skills  for recovery maintenance. The [facility] SAIOP program is offered at least 3 hours a day, 3 days a week. SAIOP services shall include a structured program consisting of, but not limited to, the following services: Individual counseling and support; Group counseling and support; Family counseling, training or support; Biochemical assays to identify recent drug use (e.g., urine drug screens); Strategies for relapse prevention to include community and social support systems in treatment; Life skills; Crisis contingency planning; Disease Management; and Treatment support activities that have been adapted or specifically designed for persons with physical disabilities, or persons with co-occurring disorders of mental illness and substance abuse/dependence or mental retardation/developmental disability and substance abuse/dependence.  Phone: [phone]   [facility])  Address: [address] [place], [place], [place] [address] Phone: [phone]   [facility] Address: [address] [place], [place] [address] Phone: [phone]  - a combination of group and individual sessions to meet the participants needs. This allows participants to engage in treatment and remain involved in their home and work life. - Transitional housing places program participants in a supportive living environment while they complete a treatment program and work to secure independent housing. - The Substance Abuse Intensive Outpatient Treatment Program at [employer] consists of structured group sessions and individual sessions that are designed to teach participants early recovery and relapse prevention skills. -[facility] works with the [employer] to provide a housing and treatment program for homeless veterans.   [facility] [employer] [facility] [employer].   Address: [address] [place], [place] [address] Phone#: [phone]   : Referrals to RTSA facilities  can be made by [facility].  Referrals are also accepted from physicians, private providers, hospital emergency rooms, family members, or any  person who has knowledge of someone in the need of our services.  The [facility] will also offer the following outpatient services: (Monday through Friday 8am-5pm)   Partial Hospitalization Program (PHP) Substance Abuse Intensive Outpatient Program (SA-IOP) Group Therapy Medication Management Peer Living Room We also provide (24/7):  Assessments: Our mental health clinician and providers will conduct a focused mental health evaluation, assessing for immediate safety concerns and further mental health needs. Referral: Our team will provide resources and help connect to community based mental health treatment, when indicated, including psychotherapy, psychiatry, and other specialized behavioral health or substance use disorder services (for those not already in treatment). Transitional Care: Our team providers in person bridging and/or telephonic follow-up during the patient's transition to outpatient services.   [facility] 24-Hour Call Center: [phone] Behavioral Health Crisis Line: [phone]  [facility]-will provide timely access to mental health services for children and adolescents (4-17) and adults presenting in a mental health crisis. The program is designed for those who need urgent Behavioral Health or Substance Use treatment and are not experiencing a medical crisis that would typically require an emergency room visit.    [address] [place], [place] [address] Phone: [phone] [email or web address]   The [facility] will also offer the following outpatient services: (Monday through Friday 8am-5pm)   Partial Hospitalization Program (PHP) Substance Abuse Intensive Outpatient Program (SA-IOP) Group Therapy Medication Management Peer  Living Room   We also provide (24/7):    Assessments: Our mental health clinician and providers will conduct a focused mental health evaluation, assessing for immediate safety concerns and further mental health needs.   Referral: Our team will provide resources and help connect to community based mental health treatment, when indicated, including psychotherapy, psychiatry, and other specialized behavioral health or substance use disorder services (for those not already in treatment).   Transitional Care: Our team providers in person bridging and/or telphonic follow-up during the patient's transition to outpatient services.    Crissie Reese, MSW, LCSW-A, LCAS-A Phone: [phone] Disposition/TOC

## 2021-11-10 NOTE — ED Provider Notes (Signed)
Emergency Medicine Observation Re-evaluation Note  Douglas Long is a 45 y.o. male, seen on rounds today.  Pt initially presented to the ED for complaints of Dizziness Currently, the patient is resting, no complaints. States he feels better. Denies SI/HI.  Physical Exam  BP (!) 128/93 (BP Location: Right Arm)   Pulse 93   Temp 98.1 F (36.7 C) (Oral)   Resp 17   SpO2 96%  Physical Exam General: awake, alert, no distress Cardiac: RRR Lungs: normal effort Psych: no SI/HI  ED Course / MDM  EKG:   I have reviewed the labs performed to date as well as medications administered while in observation.  Recent changes in the last 24 hours include home meds ordered.  Plan  Current plan is for psychiatry re-eval.  Douglas Long is not under involuntary commitment.     Pricilla Loveless, MD 11/10/21 [phone]

## 2021-11-10 NOTE — ED Notes (Signed)
Provided w/ DC instruction, educated on substance abuse resources.

## 2021-11-10 NOTE — ED Notes (Signed)
Breakfast tray given. °

## 2021-11-10 NOTE — ED Provider Notes (Signed)
Psych has cleared for discharge   Pricilla Loveless, MD 11/10/21 1357

## 2021-11-12 ENCOUNTER — Telehealth (HOSPITAL_COMMUNITY): Payer: Self-pay

## 2021-11-12 NOTE — BH Assessment (Signed)
Care Management - Follow Up Discharges   Writer attempted to make contact with patient today and was unsuccessful.  Writer left a HIPPA compliant voice message.   Per chart review, patient was provided with substance abuse resources.

## 2023-03-25 IMAGING — DX DG CHEST 1V PORT
1 series · 1 of 1 positions shown · non-contrast
Comparison: None.

CLINICAL DATA: Chest pain and shortness of breath.

EXAM:
PORTABLE CHEST 1 VIEW

[chest ap]
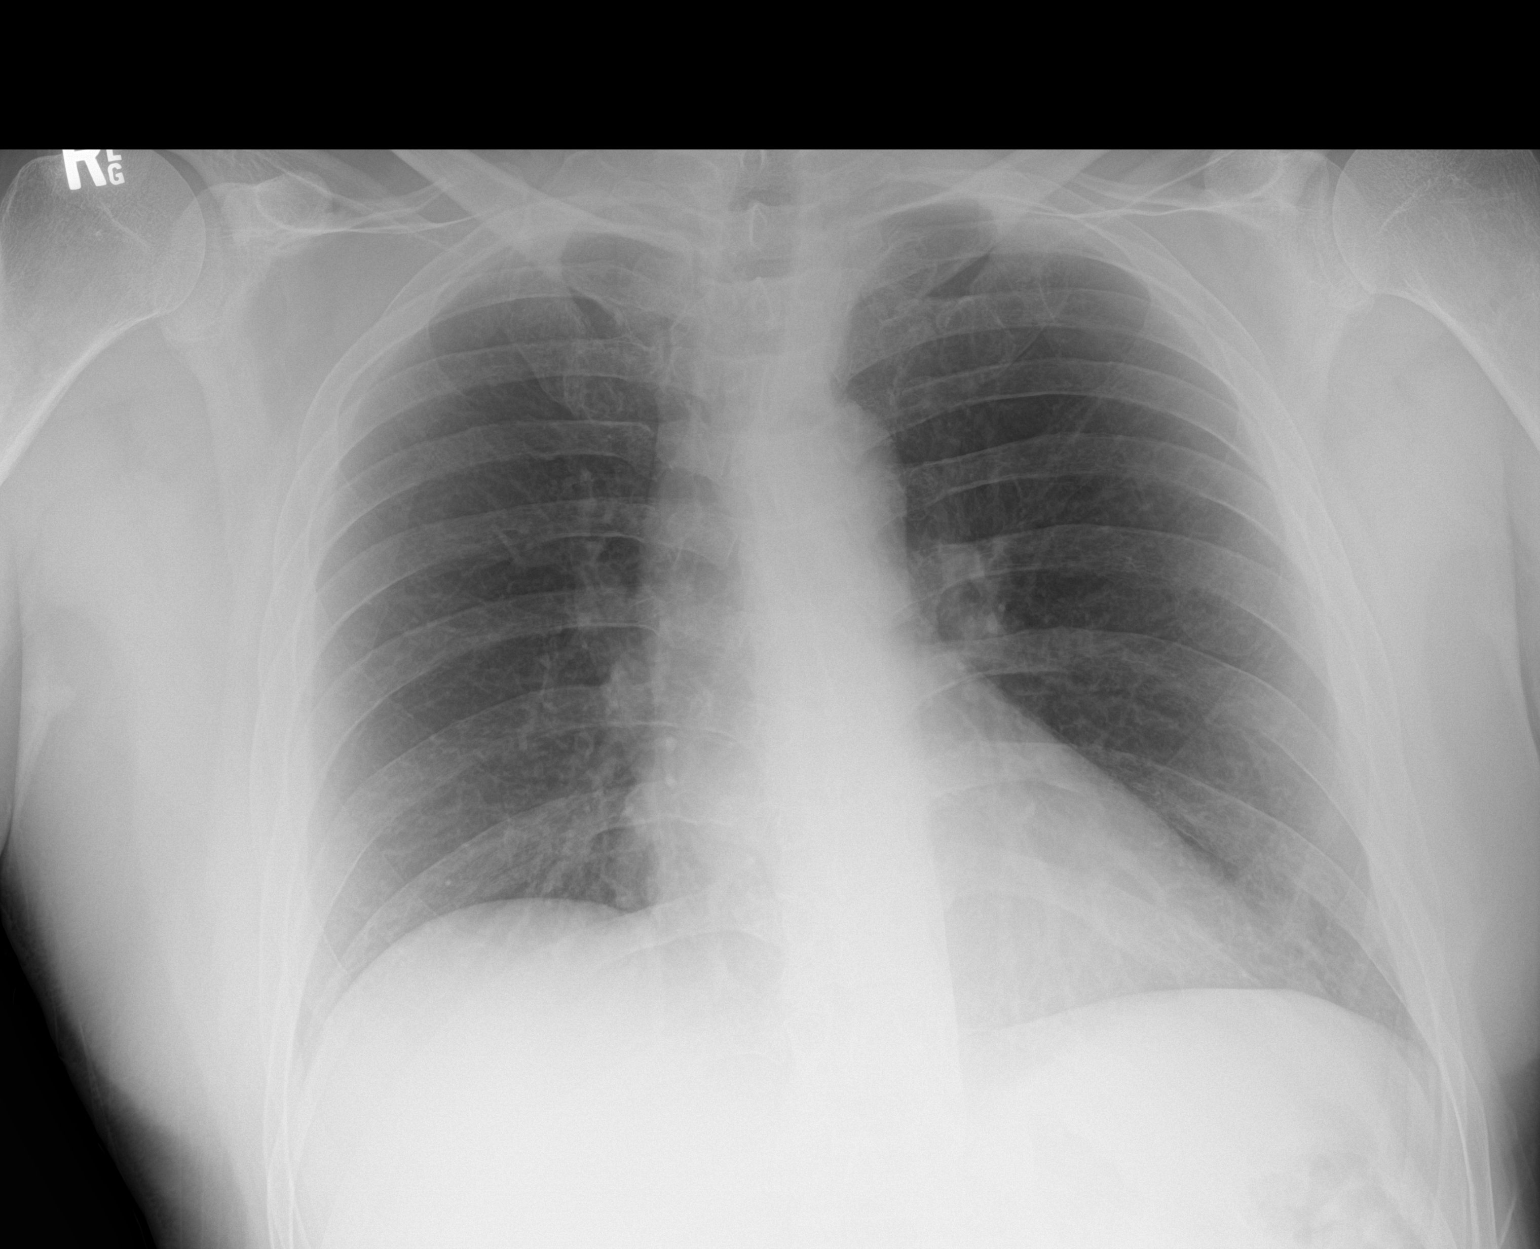

[1 of 1 positions shown; findings below may reference images not displayed]

FINDINGS: The cardiomediastinal contours are normal. Slight elevation of right
hemidiaphragm. The lungs are clear. Pulmonary vasculature is normal.
No consolidation, pleural effusion, or pneumothorax. No acute
osseous abnormalities are seen.
IMPRESSION: No acute chest findings.
